# Patient Record
Sex: Female | Born: 1950 | Race: White | Hispanic: No | Marital: Married | Smoking: Never smoker
Health system: Southern US, Community
[De-identification: ages and names within clinical notes are randomized; demographics above are authoritative.]

## PROBLEM LIST (undated history)

## (undated) DIAGNOSIS — E785 Hyperlipidemia, unspecified: Secondary | ICD-10-CM

## (undated) DIAGNOSIS — M3130 Wegener's granulomatosis without renal involvement: Secondary | ICD-10-CM

## (undated) DIAGNOSIS — B001 Herpesviral vesicular dermatitis: Secondary | ICD-10-CM

## (undated) DIAGNOSIS — C801 Malignant (primary) neoplasm, unspecified: Secondary | ICD-10-CM

## (undated) DIAGNOSIS — J40 Bronchitis, not specified as acute or chronic: Secondary | ICD-10-CM

## (undated) DIAGNOSIS — M941 Relapsing polychondritis: Secondary | ICD-10-CM

## (undated) DIAGNOSIS — M8580 Other specified disorders of bone density and structure, unspecified site: Secondary | ICD-10-CM

## (undated) DIAGNOSIS — M1990 Unspecified osteoarthritis, unspecified site: Secondary | ICD-10-CM

## (undated) DIAGNOSIS — N870 Mild cervical dysplasia: Secondary | ICD-10-CM

## (undated) DIAGNOSIS — K219 Gastro-esophageal reflux disease without esophagitis: Secondary | ICD-10-CM

## (undated) DIAGNOSIS — M542 Cervicalgia: Secondary | ICD-10-CM

## (undated) DIAGNOSIS — E039 Hypothyroidism, unspecified: Secondary | ICD-10-CM

## (undated) DIAGNOSIS — Z5189 Encounter for other specified aftercare: Secondary | ICD-10-CM

## (undated) DIAGNOSIS — U071 COVID-19: Secondary | ICD-10-CM

## (undated) DIAGNOSIS — E063 Autoimmune thyroiditis: Secondary | ICD-10-CM

## (undated) DIAGNOSIS — T7840XA Allergy, unspecified, initial encounter: Secondary | ICD-10-CM

## (undated) DIAGNOSIS — L02519 Cutaneous abscess of unspecified hand: Secondary | ICD-10-CM

## (undated) DIAGNOSIS — M810 Age-related osteoporosis without current pathological fracture: Secondary | ICD-10-CM

## (undated) HISTORY — DX: Unspecified osteoarthritis, unspecified site: M19.90

## (undated) HISTORY — DX: Wegener's granulomatosis without renal involvement: M31.30

## (undated) HISTORY — DX: COVID-19: U07.1

## (undated) HISTORY — PX: BREAST LUMPECTOMY: SHX2

## (undated) HISTORY — DX: Gastro-esophageal reflux disease without esophagitis: K21.9

## (undated) HISTORY — DX: Autoimmune thyroiditis: E06.3

## (undated) HISTORY — DX: Other specified disorders of bone density and structure, unspecified site: M85.80

## (undated) HISTORY — DX: Age-related osteoporosis without current pathological fracture: M81.0

## (undated) HISTORY — DX: Bronchitis, not specified as acute or chronic: J40

## (undated) HISTORY — DX: Hyperlipidemia, unspecified: E78.5

## (undated) HISTORY — PX: BREAST BIOPSY: SHX20

## (undated) HISTORY — DX: Allergy, unspecified, initial encounter: T78.40XA

## (undated) HISTORY — DX: Hypothyroidism, unspecified: E03.9

## (undated) HISTORY — PX: LUNG BIOPSY: SHX232

## (undated) HISTORY — DX: Herpesviral vesicular dermatitis: B00.1

## (undated) HISTORY — DX: Relapsing polychondritis: M94.1

## (undated) HISTORY — PX: WISDOM TOOTH EXTRACTION: SHX21

## (undated) HISTORY — PX: COLPOSCOPY: SHX161

## (undated) HISTORY — DX: Cutaneous abscess of unspecified hand: L02.519

## (undated) HISTORY — DX: Encounter for other specified aftercare: Z51.89

## (undated) HISTORY — PX: ESOPHAGEAL DILATION: SHX303

## (undated) HISTORY — DX: Malignant (primary) neoplasm, unspecified: C80.1

## (undated) HISTORY — DX: Cervicalgia: M54.2

## (undated) HISTORY — DX: Mild cervical dysplasia: N87.0

---

## 1999-11-22 ENCOUNTER — Other Ambulatory Visit: Admission: RE | Admit: 1999-11-22 | Discharge: 1999-11-22 | Payer: Self-pay | Admitting: Obstetrics and Gynecology

## 2000-03-26 ENCOUNTER — Encounter: Admission: RE | Admit: 2000-03-26 | Discharge: 2000-03-26 | Payer: Self-pay | Admitting: Obstetrics and Gynecology

## 2000-03-26 ENCOUNTER — Encounter: Payer: Self-pay | Admitting: Obstetrics and Gynecology

## 2001-11-02 ENCOUNTER — Other Ambulatory Visit: Admission: RE | Admit: 2001-11-02 | Discharge: 2001-11-02 | Payer: Self-pay | Admitting: Obstetrics and Gynecology

## 2002-04-06 ENCOUNTER — Encounter: Payer: Self-pay | Admitting: Obstetrics and Gynecology

## 2002-04-06 ENCOUNTER — Encounter: Admission: RE | Admit: 2002-04-06 | Discharge: 2002-04-06 | Payer: Self-pay | Admitting: Obstetrics and Gynecology

## 2004-04-23 ENCOUNTER — Other Ambulatory Visit: Admission: RE | Admit: 2004-04-23 | Discharge: 2004-04-23 | Payer: Self-pay | Admitting: Obstetrics and Gynecology

## 2004-04-25 ENCOUNTER — Encounter: Admission: RE | Admit: 2004-04-25 | Discharge: 2004-04-25 | Payer: Self-pay

## 2004-11-14 ENCOUNTER — Ambulatory Visit: Payer: Self-pay | Admitting: Internal Medicine

## 2005-10-21 ENCOUNTER — Encounter: Admission: RE | Admit: 2005-10-21 | Discharge: 2005-10-21 | Payer: Self-pay | Admitting: Obstetrics and Gynecology

## 2005-10-28 ENCOUNTER — Other Ambulatory Visit: Admission: RE | Admit: 2005-10-28 | Discharge: 2005-10-28 | Payer: Self-pay | Admitting: Obstetrics and Gynecology

## 2006-12-07 ENCOUNTER — Encounter: Admission: RE | Admit: 2006-12-07 | Discharge: 2006-12-07 | Payer: Self-pay | Admitting: Obstetrics and Gynecology

## 2006-12-11 ENCOUNTER — Encounter: Admission: RE | Admit: 2006-12-11 | Discharge: 2006-12-11 | Payer: Self-pay | Admitting: Obstetrics and Gynecology

## 2006-12-14 ENCOUNTER — Other Ambulatory Visit: Admission: RE | Admit: 2006-12-14 | Discharge: 2006-12-14 | Payer: Self-pay | Admitting: Obstetrics and Gynecology

## 2008-04-18 ENCOUNTER — Encounter: Admission: RE | Admit: 2008-04-18 | Discharge: 2008-04-18 | Payer: Self-pay | Admitting: Obstetrics and Gynecology

## 2008-04-19 ENCOUNTER — Ambulatory Visit: Payer: Self-pay | Admitting: Obstetrics and Gynecology

## 2008-04-19 ENCOUNTER — Other Ambulatory Visit: Admission: RE | Admit: 2008-04-19 | Discharge: 2008-04-19 | Payer: Self-pay | Admitting: Obstetrics and Gynecology

## 2008-04-19 ENCOUNTER — Encounter: Payer: Self-pay | Admitting: Obstetrics and Gynecology

## 2008-10-16 ENCOUNTER — Ambulatory Visit: Payer: Self-pay | Admitting: Internal Medicine

## 2008-10-16 DIAGNOSIS — K219 Gastro-esophageal reflux disease without esophagitis: Secondary | ICD-10-CM | POA: Insufficient documentation

## 2008-10-16 DIAGNOSIS — E785 Hyperlipidemia, unspecified: Secondary | ICD-10-CM | POA: Insufficient documentation

## 2008-10-16 DIAGNOSIS — E039 Hypothyroidism, unspecified: Secondary | ICD-10-CM | POA: Insufficient documentation

## 2008-10-16 DIAGNOSIS — M810 Age-related osteoporosis without current pathological fracture: Secondary | ICD-10-CM | POA: Insufficient documentation

## 2008-10-16 DIAGNOSIS — M3130 Wegener's granulomatosis without renal involvement: Secondary | ICD-10-CM | POA: Insufficient documentation

## 2008-10-16 DIAGNOSIS — M948X9 Other specified disorders of cartilage, unspecified sites: Secondary | ICD-10-CM | POA: Insufficient documentation

## 2008-10-18 ENCOUNTER — Ambulatory Visit: Payer: Self-pay | Admitting: Internal Medicine

## 2008-10-24 ENCOUNTER — Encounter (INDEPENDENT_AMBULATORY_CARE_PROVIDER_SITE_OTHER): Payer: Self-pay | Admitting: *Deleted

## 2009-05-14 ENCOUNTER — Encounter: Payer: Self-pay | Admitting: Internal Medicine

## 2009-11-12 ENCOUNTER — Encounter: Admission: RE | Admit: 2009-11-12 | Discharge: 2009-11-12 | Payer: Self-pay | Admitting: Obstetrics and Gynecology

## 2009-11-21 ENCOUNTER — Encounter: Admission: RE | Admit: 2009-11-21 | Discharge: 2009-11-21 | Payer: Self-pay | Admitting: Obstetrics and Gynecology

## 2009-11-29 ENCOUNTER — Other Ambulatory Visit: Admission: RE | Admit: 2009-11-29 | Discharge: 2009-11-29 | Payer: Self-pay | Admitting: Obstetrics and Gynecology

## 2009-11-29 ENCOUNTER — Ambulatory Visit: Payer: Self-pay | Admitting: Obstetrics and Gynecology

## 2009-12-11 ENCOUNTER — Encounter: Payer: Self-pay | Admitting: Internal Medicine

## 2009-12-11 ENCOUNTER — Encounter (INDEPENDENT_AMBULATORY_CARE_PROVIDER_SITE_OTHER): Payer: Self-pay | Admitting: *Deleted

## 2009-12-17 ENCOUNTER — Telehealth (INDEPENDENT_AMBULATORY_CARE_PROVIDER_SITE_OTHER): Payer: Self-pay | Admitting: *Deleted

## 2010-01-01 ENCOUNTER — Ambulatory Visit (HOSPITAL_BASED_OUTPATIENT_CLINIC_OR_DEPARTMENT_OTHER): Admission: RE | Admit: 2010-01-01 | Discharge: 2010-01-01 | Payer: Self-pay | Admitting: Surgery

## 2010-01-01 ENCOUNTER — Encounter: Admission: RE | Admit: 2010-01-01 | Discharge: 2010-01-01 | Payer: Self-pay | Admitting: Surgery

## 2010-01-07 ENCOUNTER — Ambulatory Visit: Payer: Self-pay | Admitting: Internal Medicine

## 2010-01-07 LAB — CONVERTED CEMR LAB: Vit D, 25-Hydroxy: 43 ng/mL (ref 30–89)

## 2010-01-13 LAB — CONVERTED CEMR LAB
Albumin: 3.8 g/dL (ref 3.5–5.2)
Alkaline Phosphatase: 85 units/L (ref 39–117)
Basophils Relative: 0.6 % (ref 0.0–3.0)
Bilirubin, Direct: 0.1 mg/dL (ref 0.0–0.3)
CO2: 27 meq/L (ref 19–32)
Chloride: 109 meq/L (ref 96–112)
Cholesterol: 229 mg/dL — ABNORMAL HIGH (ref 0–200)
Creatinine, Ser: 0.9 mg/dL (ref 0.4–1.2)
Direct LDL: 161.3 mg/dL
Glucose, Bld: 101 mg/dL — ABNORMAL HIGH (ref 70–99)
HCT: 38.9 % (ref 36.0–46.0)
Lymphocytes Relative: 21.1 % (ref 12.0–46.0)
Lymphs Abs: 1.3 10*3/uL (ref 0.7–4.0)
MCV: 87.3 fL (ref 78.0–100.0)
Neutrophils Relative %: 66.7 % (ref 43.0–77.0)
Platelets: 383 10*3/uL (ref 150.0–400.0)
Sodium: 140 meq/L (ref 135–145)
Total CHOL/HDL Ratio: 5

## 2010-01-14 ENCOUNTER — Ambulatory Visit: Payer: Self-pay | Admitting: Internal Medicine

## 2010-01-14 DIAGNOSIS — Z9189 Other specified personal risk factors, not elsewhere classified: Secondary | ICD-10-CM | POA: Insufficient documentation

## 2010-01-16 ENCOUNTER — Encounter: Payer: Self-pay | Admitting: Internal Medicine

## 2010-04-22 ENCOUNTER — Ambulatory Visit: Payer: Self-pay | Admitting: Internal Medicine

## 2010-04-29 LAB — CONVERTED CEMR LAB
Cholesterol: 200 mg/dL (ref 0–200)
TSH: 0.03 microintl units/mL — ABNORMAL LOW (ref 0.35–5.50)
VLDL: 24 mg/dL (ref 0.0–40.0)

## 2010-05-23 ENCOUNTER — Ambulatory Visit: Payer: Self-pay | Admitting: Internal Medicine

## 2010-05-28 ENCOUNTER — Encounter: Payer: Self-pay | Admitting: Internal Medicine

## 2010-07-14 ENCOUNTER — Encounter: Payer: Self-pay | Admitting: Obstetrics and Gynecology

## 2010-07-21 LAB — CONVERTED CEMR LAB
Cholesterol, target level: 200 mg/dL
Cholesterol: 201 mg/dL — ABNORMAL HIGH (ref 0–200)
Direct LDL: 123.9 mg/dL
HDL goal, serum: 40 mg/dL
Hgb A1c MFr Bld: 5.8 % (ref 4.6–6.5)
LDL Goal: 160 mg/dL
Vit D, 25-Hydroxy: 24 ng/mL — ABNORMAL LOW (ref 30–89)

## 2010-07-23 NOTE — Letter (Signed)
Summary: Primary Care Appointment Letter  [facility] [place]  [address] [place], [place] [address]   Phone: [phone]  Fax: [phone]    12/11/2009 MRN: [id number]  [facility] [address] [place], [place]  [address]  Dear Ms. Molly Erickson,   Your Primary Care Physician Marga Melnick MD has indicated that:    ____X___it is time to schedule an appointment FOR CPX AND FASTING LABS BEFORE FURTHER REFILLS WILL BE GIVEN. LAST OFFICE VISIT 10/18/2008.    _______you missed your appointment on______ and need to call and          reschedule.    _______you need to have lab work done.    _______you need to schedule an appointment discuss lab or test results.    _______you need to call to reschedule your appointment that is                       scheduled on _________.     Please call our office as soon as possible. Our phone number is [phone]_____.Our office is open 8a-5p, Monday through Friday.     Thank you,    [place] [facility] Scheduler

## 2010-07-23 NOTE — Letter (Signed)
Summary: [facility]   Imported By: Lanelle Bal 12/31/2009 10:47:30  _____________________________________________________________________  External Attachment:    Type:   Image     Comment:   External Document

## 2010-07-23 NOTE — Assessment & Plan Note (Signed)
Summary: CPX ---ALREADY HAD LABS////SPH   Vital Signs:  Patient profile:   60 year old female Height:      67.75 inches Weight:      190.4 pounds BMI:     29.27 Temp:     97.6 degrees F oral Pulse rate:   76 / minute Resp:     14 per minute BP sitting:   116 / 78  (left arm) Cuff size:   large  Vitals Entered By: Shonna Chock CMA (January 14, 2010 11:04 AM)  CC: Lipid Management   CC:  Lipid Management.  History of Present Illness: Molly Erickson is here for a physical; she is presently asymptomatic.  Lipid Management History:      Positive NCEP/ATP III risk factors include female age 30 years old or older.  Negative NCEP/ATP III risk factors include no history of early menopause without estrogen hormone replacement, non-diabetic, no family history for ischemic heart disease, non-tobacco-user status, non-hypertensive, no ASHD (atherosclerotic heart disease), no prior stroke/TIA, no peripheral vascular disease, and no history of aortic aneurysm.     Current Medications (verified): 1)  Synthroid 175 Mcg Tabs (Levothyroxine Sodium) .Marland Kitchen.. 1 By Mouth Once Daily Except 1/2 Every Weds- Office Visit and Labs Due Now 2)  Nexium 40 Mg Cpdr (Esomeprazole Magnesium) .Marland Kitchen.. 1 By Mouth Once Daily As Needed 3)  Vit D ? Dose .Marland Kitchen.. 1 By Mouth Once Daily 4)  Citrate-? Dose (Calcium and Vit D) .Marland Kitchen.. 1 By Mouth Once Daily 5)  Imran-? Dose .Marland Kitchen.. 1 By Mouth Once Daily  Allergies (verified): No Known Drug Allergies  Past History:  Past Medical History: Wegener's ,PMH of 1989  ; Polychondritis , Prednisone   as  intermittent burst therapy over 2 years , mainitenance dose of 10 mg  for period 04/2009 - 11/2009; Imuran therapy since 04/2009 GERD Hypothyroidism Osteoporosis Hyperlipidemia: NMR Lipoprofile: LDL 124(1435/ 606), HDL 52, TG 122. LDL goal = < 115, ideally < 85. Framingham Study LDL goal = < 160.  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 Eyes:  Denies blurring, double vision, and vision loss-both eyes. ENT:  Denies difficulty swallowing, hoarseness, nasal congestion, and postnasal drainage. CV:  Complains of swelling of hands; denies chest pain or discomfort, difficulty breathing at night, difficulty breathing while lying down, leg cramps with exertion, palpitations, shortness of breath with exertion, and swelling of feet. Resp:  Complains of cough; denies chest pain with inspiration and wheezing; Scant sputum  yellow in am; cough since 2 nd week of June. GI:  Denies abdominal pain, bloody stools, dark tarry stools, and indigestion; No dysphagia;  some  dyspepsia, Nexium taken "most nights". GU:  Denies discharge, dysuria, and hematuria. MS:  Denies joint pain, joint redness, joint swelling, low back pain, mid back pain, and thoracic pain. Derm:  Denies changes in nail beds, dryness, and hair loss. Neuro:  Denies disturbances in coordination, numbness, poor balance, and  tingling. Psych:  Denies anxiety, depression, easily angered, easily tearful, and irritability. Endo:  Complains of cold intolerance; denies excessive hunger, excessive thirst, excessive urination, and heat intolerance. Heme:  Denies abnormal bruising and bleeding. Allergy:  Denies itching eyes and sneezing.  Physical Exam  General:  well-nourished; alert,appropriate and cooperative  throughout examination Head:  Normocephalic and atraumatic without obvious abnormalities. Eyes:  No corneal or conjunctival inflammation noted.Perrla. Funduscopic exam benign, without hemorrhages, exudates or papilledema. Ears:  External ear exam shows no significant lesions or deformities.  Otoscopic examination reveals clear canals, tympanic membranes are intact bilaterally without bulging, retraction, inflammation or discharge. Hearing is grossly normal bilaterally. Nose:  External nasal examination shows no deformity or inflammation. Nasal mucosa are pink and moist without lesions or exudates. Mouth:  Oral mucosa and oropharynx without lesions or exudates.  Teeth in good repair. Neck:  No deformities, masses, or tenderness noted. Lungs:  Normal respiratory effort, chest expands symmetrically. Lungs are clear to auscultation, no crackles or wheezes. Minor dry cough Heart:  Normal rate and regular rhythm. S1 and S2 normal without gallop, murmur, click, rub .S4 Abdomen:  Bowel sounds positive,abdomen soft and non-tender without masses, organomegaly or hernias noted. Genitalia:  Dr Eda Paschal Msk:  No deformity or scoliosis noted of thoracic or lumbar spine.   Pulses:  R and L carotid,radial,dorsalis pedis and posterior tibial pulses are full and equal bilaterally Extremities:  No clubbing, cyanosis, edema, or deformity noted with normal full range of motion of all joints.   Neurologic:  alert & oriented X3 and DTRs symmetrical and normal.   Skin:  Intact without suspicious lesions or rashes Cervical Nodes:  No lymphadenopathy noted Axillary Nodes:  No palpable lymphadenopathy Psych:  memory intact for recent and remote, normally interactive, and good eye contact.     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 1 by mouth once daily as needed  Problem # 7:  HYPOTHYROIDISM (ICD-244.9)  Her updated medication list for this problem includes:    Synthroid 175 Mcg Tabs (Levothyroxine sodium) .Marland Kitchen... 1 by mouth once daily except 1/2 every mon, weds , & fri  Problem # 8:  OSTEOPOROSIS (ICD-733.00)  Complete Medication List: 1)  Synthroid 175 Mcg Tabs (Levothyroxine sodium) .Marland Kitchen.. 1 by mouth once daily except 1/2 every mon, weds , & fri 2)  Nexium 40 Mg Cpdr (Esomeprazole magnesium) .Marland Kitchen.. 1 by mouth once daily as needed 3)  Vit D ? Dose  .Marland KitchenMarland Kitchen. 1 by mouth once daily 4)  Citrate-? Dose (calcium and Vit D)  .Marland Kitchen.. 1 by mouth once daily 5)  Imran-? Dose  .Marland KitchenMarland Kitchen. 1 by mouth once daily 6)  Azithromycin 250 Mg Tabs (Azithromycin) .... As per pack  Lipid Assessment/Plan:      Based on NCEP/ATP III, the patient's risk factor category is "0-1 risk factors".  The patient's lipid goals are as follows: Total cholesterol goal is 200; LDL cholesterol goal is 160; HDL cholesterol goal is 40; Triglyceride goal is 150.    Patient Instructions:  1)  Avoid foods high in acid (tomatoes, citrus juices, spicy foods). Avoid eating within two hours of lying down or before exercising. Do not over eat; try smaller more frequent meals. Elevate head of bed twelve inches when sleeping. Take Nexium 30 min pre b'fast & Synthroid at bedtime . Review Dr Gildardo Griffes book Eat, Drink & Be Healthy for dietary cholesterol. Please schedule a follow-up  fasting  Lab appointment in 3 months for lipids & TSH  ( 244.9,272.4).It is important that you exercise regularly at least 20 minutes 5 times a week. If you develop chest pain, have severe difficulty breathing,  or feel very tired , stop exercising immediately and seek medical attention. BMD every 25 months; vitamin D level annually. CXray if cough persists post antibiotics. Prescriptions: AZITHROMYCIN 250 MG TABS (AZITHROMYCIN) as per pack  #1 x 0   Entered and Authorized by:   Marga Melnick MD   Signed by:   Marga Melnick MD on 01/14/2010   Method used:   Print then Give to Patient   RxID:   [id number] SYNTHROID 175 MCG TABS (LEVOTHYROXINE SODIUM) 1 by mouth once daily EXCEPT 1/2 every Mon, Weds , & Fri Brand medically necessary #90 x 3   Entered and Authorized by:   Marga Melnick MD   Signed by:   Marga Melnick MD on 01/14/2010   Method used:   Print then Give to Patient   RxID:   [id number]    Tetanus Vaccine (to be given today)   Appended Document: CPX ---ALREADY HAD LABS////SPH   Immunizations Administered:  Tetanus Vaccine:    Vaccine Type: Tdap    Site: right deltoid    Mfr: GlaxoSmithK[employer]    Dose: 0.5 ml    Route: IM    Given by: Shonna Chock CMA    Exp. Date: 09/15/2011    Lot #: [id number]    VIS given: 05/11/07 version given January 14, 2010.

## 2010-07-23 NOTE — Assessment & Plan Note (Signed)
Summary: DISCUSS   Vital Signs:  Patient profile:   60 year old female Weight:      181 pounds BMI:     27.82 Pulse rate:   64 / minute Resp:     14 per minute BP sitting:   128 / 82  (left arm) Cuff size:   large  Vitals Entered By: Shonna Chock CMA (May 23, 2010 10:32 AM) CC: Follow-up visit: discuss labs (dicuss labs), Lipid Management   CC:  Follow-up visit: discuss labs (dicuss labs) and Lipid Management.  History of Present Illness:        Thyroid function tests reviewed ; the TSH has DECREASED despite decrease in dose. Physiology of thyroid : pituitary axis discussed. Hyperlipidemia Follow-Up       The patient denies the following symptoms: chest pain/pressure, exercise intolerance, dypsnea, and palpitations.  Dietary compliance has been good.  The patient reports exercising 4-5 X per week.  LDL 133, down from 161. NMR goal  = < 115.  Lipid Management History:      Positive NCEP/ATP III risk factors include female age 7 years old or older.  Negative NCEP/ATP III risk factors include no history of early menopause without estrogen hormone replacement, non-diabetic, no family history for ischemic heart disease, non-tobacco-user status, non-hypertensive, no ASHD (atherosclerotic heart disease), no prior stroke/TIA, no peripheral vascular disease, and no history of aortic aneurysm.     Current Medications (verified): 1)  Synthroid 175 Mcg Tabs (Levothyroxine Sodium) .Marland Kitchen.. 1 By Mouth Once Daily Except 1/2 Every Mon, Weds , & Fri 2)  Nexium 40 Mg Cpdr (Esomeprazole Magnesium) .Marland Kitchen.. 1 By Mouth Once Daily As Needed 3)  Vit D ? Dose .Marland Kitchen.. 1 By Mouth Once Daily 4)  Citrate-? Dose (Calcium and Vit D) .Marland Kitchen.. 1 By Mouth Once Daily 5)  Imuran 50 Mg Tabs (Azathioprine) .Marland Kitchen.. 1 By Mouth Two Times A Day  Allergies (verified): No Known Drug Allergies  Past History:  Past Medical History: Wegener's ,PMH of 1989  ; Polychondritis , Prednisone   as  intermittent burst therapy over 2 years ,  mainitenance dose of 10 mg  for period 04/2009 - 11/2009; Imuran therapy since 04/2009 GERD Hypothyroidism Osteoporosis Hyperlipidemia: NMR Lipoprofile: LDL 124(1435/ 606), HDL 52, TG 122. LDL goal = < 115, ideally < 85. Framingham Study LDL goal = < 160. No premature MI in FH.  Review of Systems General:  Variable fatigue , esp late afternoon. Weight down 10#.Marland Kitchen CV:  Complains of swelling of hands. GI:  Denies constipation and diarrhea. Derm:  Denies changes in nail beds, dryness, and hair loss. Neuro:  Denies numbness and tingling. Endo:  Complains of cold intolerance; denies heat intolerance.  Physical Exam  General:  healthy-appearing.   Eyes:   Perrla. No lid lag Neck:  No deformities, masses, or tenderness noted. Heart:  Normal rate and regular rhythm. S1 and S2 normal without gallop, murmur, click, rub . Neurologic:  DTRs symmetrical and normal.   No tremor Skin:  Intact without suspicious lesions or rashes Psych:  normally interactive and good eye contact.     Impression & Recommendations:  Problem # 1:  HYPERLIPIDEMIA (ICD-272.4) LDL control good  Problem # 2:  HYPOTHYROIDISM (ICD-244.9) Over supplementation Her updated medication list for this problem includes:    Synthroid 175 Mcg Tabs (Levothyroxine sodium) .Marland Kitchen... 1 by mouth once daily except 1/2 every mon, weds ,  fri & sun  Complete Medication List: 1)  Synthroid 175 Mcg Tabs (  Levothyroxine sodium) .Marland Kitchen.. 1 by mouth once daily except 1/2 every mon, weds ,  fri & sun 2)  Nexium 40 Mg Cpdr (Esomeprazole magnesium) .Marland Kitchen.. 1 by mouth once daily as needed 3)  Vit D ? Dose  .Marland KitchenMarland Kitchen. 1 by mouth once daily 4)  Citrate-? Dose (calcium and Vit D)  .Marland Kitchen.. 1 by mouth once daily 5)  Imuran 50 Mg Tabs (Azathioprine) .Marland Kitchen.. 1 by mouth two times a day  Lipid Assessment/Plan:      Based on NCEP/ATP III, the patient's risk factor category is "0-1 risk factors".  The patient's lipid goals are as follows: Total cholesterol goal is 200; LDL  cholesterol goal is 115; HDL cholesterol goal is 40; Triglyceride goal is 150.    Patient Instructions: 1)  Please schedule a follow-up  lab appointment in 9-10  weeks for : 2)  TSH , free T3, free T4.   Orders Added: 1)  Est. Patient Level III [[address]]

## 2010-07-23 NOTE — Progress Notes (Signed)
Summary: NEED LAB WORK INFO FOR HER CPX--ADDED  Phone Note Call from Patient Call back at [facility] = [phone]   Caller: Patient Summary of Call: PATIENT HAS LABS SCHEDULED FOR 7/18  BECAUSE SHE HAS A CPX FOR 7/25---PLEASE LET ME KNOW WHAT LABS TO PUT IN FOR HER Initial call taken by: Jerolyn Shin,  December 17, 2009 11:16 AM  Follow-up for Phone Call        Lipid/Hep/CBCD/BMP/TSH/Vit D/Stool cards/Udip 272.4/v70.0/244.9/995.20 Follow-up by: Shonna Chock,  December 17, 2009 12:14 PM  Additional Follow-up for Phone Call Additional follow up Details #1::        ADDED INFO TO 7/18 LAB Additional Follow-up by: Jerolyn Shin,  December 17, 2009 2:21 PM

## 2010-07-23 NOTE — Letter (Signed)
Summary: [facility]   Imported By: Lanelle Bal 01/29/2010 14:17:38  _____________________________________________________________________  External Attachment:    Type:   Image     Comment:   External Document

## 2010-07-25 NOTE — Letter (Signed)
Summary: [facility]   Imported By: Lanelle Bal 06/19/2010 07:42:41  _____________________________________________________________________  External Attachment:    Type:   Image     Comment:   External Document

## 2010-07-29 ENCOUNTER — Other Ambulatory Visit: Payer: Self-pay

## 2010-07-31 ENCOUNTER — Other Ambulatory Visit: Payer: Self-pay

## 2010-07-31 ENCOUNTER — Other Ambulatory Visit: Payer: Self-pay | Admitting: Internal Medicine

## 2010-07-31 ENCOUNTER — Encounter (INDEPENDENT_AMBULATORY_CARE_PROVIDER_SITE_OTHER): Payer: Self-pay | Admitting: *Deleted

## 2010-07-31 DIAGNOSIS — E039 Hypothyroidism, unspecified: Secondary | ICD-10-CM

## 2010-07-31 LAB — T4, FREE: Free T4: 1.29 ng/dL (ref 0.60–1.60)

## 2010-07-31 LAB — T3, FREE: T3, Free: 2.7 pg/mL (ref 2.3–4.2)

## 2010-08-05 ENCOUNTER — Telehealth (INDEPENDENT_AMBULATORY_CARE_PROVIDER_SITE_OTHER): Payer: Self-pay | Admitting: *Deleted

## 2010-08-06 ENCOUNTER — Other Ambulatory Visit: Payer: BC Managed Care – PPO

## 2010-08-06 ENCOUNTER — Encounter (INDEPENDENT_AMBULATORY_CARE_PROVIDER_SITE_OTHER): Payer: Self-pay | Admitting: *Deleted

## 2010-08-06 ENCOUNTER — Other Ambulatory Visit: Payer: Self-pay | Admitting: Internal Medicine

## 2010-08-06 DIAGNOSIS — R5383 Other fatigue: Secondary | ICD-10-CM

## 2010-08-06 DIAGNOSIS — R5381 Other malaise: Secondary | ICD-10-CM

## 2010-08-06 LAB — CBC WITH DIFFERENTIAL/PLATELET
Eosinophils Absolute: 0.1 10*3/uL (ref 0.0–0.7)
Eosinophils Relative: 2.4 % (ref 0.0–5.0)
Lymphs Abs: 0.9 10*3/uL (ref 0.7–4.0)
Monocytes Relative: 5.8 % (ref 3.0–12.0)
Neutro Abs: 4.2 10*3/uL (ref 1.4–7.7)
Platelets: 274 10*3/uL (ref 150.0–400.0)
RDW: 15.2 % — ABNORMAL HIGH (ref 11.5–14.6)
WBC: 5.5 10*3/uL (ref 4.5–10.5)

## 2010-08-09 ENCOUNTER — Ambulatory Visit (INDEPENDENT_AMBULATORY_CARE_PROVIDER_SITE_OTHER): Payer: BC Managed Care – PPO | Admitting: Internal Medicine

## 2010-08-09 ENCOUNTER — Encounter: Payer: Self-pay | Admitting: Internal Medicine

## 2010-08-09 DIAGNOSIS — E039 Hypothyroidism, unspecified: Secondary | ICD-10-CM

## 2010-08-09 DIAGNOSIS — R5381 Other malaise: Secondary | ICD-10-CM | POA: Insufficient documentation

## 2010-08-09 DIAGNOSIS — R5383 Other fatigue: Secondary | ICD-10-CM

## 2010-08-09 DIAGNOSIS — Z9189 Other specified personal risk factors, not elsewhere classified: Secondary | ICD-10-CM | POA: Insufficient documentation

## 2010-08-14 NOTE — Assessment & Plan Note (Signed)
Summary: Follow-up on labs   Vital Signs:  Patient profile:   60 year old female Weight:      178.2 pounds BMI:     27.39 Pulse rate:   76 / minute Resp:     12 per minute BP sitting:   116 / 78  (left arm) Cuff size:   large  Vitals Entered By: Shonna Chock CMA (August 09, 2010 2:05 PM) CC: Follow-up visit: discuss labs (copy given) , Fatigue   CC:  Follow-up visit: discuss labs (copy given)  and Fatigue.  History of Present Illness:   She  presents with Fatigue  by late afternoon for months even  without physical  activity; primarily motivational fatigue.  The patient denies fever, night sweats, weight loss, exertional chest pain, dyspnea, cough, and hemoptysis.  Other symptoms include severe snoring but no apnea.  The patient denies the following symptoms: leg swelling, orthopnea, PND, melena, adenopathy, daytime sleepiness, and  hair/ skin changes.   Nails brittle.  Current Medications (verified): 1)  Synthroid 175 Mcg Tabs (Levothyroxine Sodium) .... 1/2 Once Daily Except 1 On Weds 2)  Nexium 40 Mg Cpdr (Esomeprazole Magnesium) .Marland Kitchen.. 1 By Mouth Once Daily As Needed 3)  Vit D ? Dose .Marland Kitchen.. 1 By Mouth Once Daily 4)  Citrate-? Dose (Calcium and Vit D) .Marland Kitchen.. 1 By Mouth Once Daily 5)  Imuran 50 Mg Tabs (Azathioprine) .Marland Kitchen.. 1 By Mouth Two Times A Day  Allergies (verified): No Known Drug Allergies  Physical Exam  General:  well-nourished,in no acute distress; alert,appropriate and cooperative throughout examination Mouth:  Oral mucosa and oropharynx without lesions or exudates.  Teeth in good repair. Some crowding of oropharynx Heart:  Normal rate and regular rhythm. S1 and S2 normal without gallop, murmur, click, rub . S4 Extremities:  No onycholysis Neurologic:  DTRs symmetrical and normal.  No tremor Skin:  Intact without suspicious lesions or rashes   Impression & Recommendations:  Problem # 1:  FATIGUE (ICD-780.79)  Orders: Sleep Disorder Referral (Sleep  Disorder)  Problem # 2:  SNORING, HX OF (ICD-V15.89)  Orders: Sleep Disorder Referral (Sleep Disorder)  Problem # 3:  HYPOTHYROIDISM (ICD-244.9) TSH too low; thyroid adjusted Her updated medication list for this problem includes:    Synthroid 175 Mcg Tabs (Levothyroxine sodium) .Marland Kitchen... 1/2 once daily except 1 on weds  Complete Medication List: 1)  Synthroid 175 Mcg Tabs (Levothyroxine sodium) .... 1/2 once daily except 1 on weds 2)  Nexium 40 Mg Cpdr (Esomeprazole magnesium) .Marland Kitchen.. 1 by mouth once daily as needed 3)  Vit D ? Dose  .Marland KitchenMarland Kitchen. 1 by mouth once daily 4)  Citrate-? Dose (calcium and Vit D)  .Marland Kitchen.. 1 by mouth once daily 5)  Imuran 50 Mg Tabs (Azathioprine) .Marland Kitchen.. 1 by mouth two times a day  Patient Instructions: 1)  TSH  in 8-10 weeks  , ICD-9:244.9.   Orders Added: 1)  Est. Patient Level III [51884] 2)  Sleep Disorder Referral [Sleep Disorder]

## 2010-08-14 NOTE — Progress Notes (Signed)
Summary: Lab results, Patient with Concern(s)   Phone Note Outgoing Call Call back at [facility] Phone [phone]   Call placed by: Shonna Chock CMA,  August 05, 2010 1:33 PM Call placed to: Patient Summary of Call: Spoke with patient: The correct new dose is Synthroid 175 micrograms 1/2 once daily EXCEPT 1 pill on Weds.TSH goal = 1-3. Recheck TSH in 8 -9 weeks (244.9). Hopp  Patient state metabolism is slower and she is more fatigued. Patient wonders if her level could be out of range due some other factor (other than med needs to be adjusted). Dr.Hopper please advise   Shonna Chock CMA  August 05, 2010 1:33 PM   Follow-up for Phone Call        Recommend CBC & dif, B12 level  then OV with meds (780.79) Follow-up by: Marga Melnick MD,  August 05, 2010 2:19 PM  Additional Follow-up for Phone Call Additional follow up Details #1::        Left message on machine to call back to office. Lucious Groves CMA  August 05, 2010 2:57 PM     Additional Follow-up for Phone Call Additional follow up Details #2::    Spoke with patient, scheduled appointment(s) Follow-up by: Shonna Chock CMA,  August 05, 2010 4:45 PM

## 2010-08-27 ENCOUNTER — Telehealth (INDEPENDENT_AMBULATORY_CARE_PROVIDER_SITE_OTHER): Payer: Self-pay | Admitting: *Deleted

## 2010-08-29 ENCOUNTER — Institutional Professional Consult (permissible substitution): Payer: BC Managed Care – PPO | Admitting: Pulmonary Disease

## 2010-09-03 NOTE — Progress Notes (Signed)
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 Patient would like to know what Dr.Hopper thinks about her canceling appointment.   Dr.Hopper please advise./Chrae Marlynn Perking CMA  August 27, 2010 4:32 PM   Follow-up for Phone Call        OK but monitor for apnea symptoms Follow-up by: Marga Melnick MD,  August 27, 2010 4:56 PM  Additional Follow-up for Phone Call Additional follow up Details #1::        spoke w/ patient aware of instructions ....Marland KitchenMarland KitchenDoristine Devoid CMA  August 28, 2010 9:10 AM

## 2010-10-10 ENCOUNTER — Other Ambulatory Visit: Payer: BC Managed Care – PPO

## 2010-10-10 ENCOUNTER — Other Ambulatory Visit: Payer: Self-pay | Admitting: Internal Medicine

## 2010-10-10 DIAGNOSIS — E039 Hypothyroidism, unspecified: Secondary | ICD-10-CM

## 2010-10-16 ENCOUNTER — Other Ambulatory Visit (INDEPENDENT_AMBULATORY_CARE_PROVIDER_SITE_OTHER): Payer: BC Managed Care – PPO

## 2010-10-16 ENCOUNTER — Other Ambulatory Visit: Payer: BC Managed Care – PPO

## 2010-10-16 DIAGNOSIS — E039 Hypothyroidism, unspecified: Secondary | ICD-10-CM

## 2010-11-15 ENCOUNTER — Ambulatory Visit (INDEPENDENT_AMBULATORY_CARE_PROVIDER_SITE_OTHER): Payer: BC Managed Care – PPO | Admitting: Internal Medicine

## 2010-11-15 ENCOUNTER — Encounter: Payer: Self-pay | Admitting: Internal Medicine

## 2010-11-15 DIAGNOSIS — L02519 Cutaneous abscess of unspecified hand: Secondary | ICD-10-CM

## 2010-11-15 HISTORY — DX: Cutaneous abscess of unspecified hand: L02.519

## 2010-11-15 MED ORDER — DOXYCYCLINE HYCLATE 100 MG PO TABS: 100.0000 mg | ORAL_TABLET | Freq: Two times a day (BID) | ORAL | Status: AC

## 2010-11-15 NOTE — Patient Instructions (Signed)
Antibiotics as prescribed Soak in warm-salted water UC or ER if symptoms severe, fever, increase swelling Call next week if not improving

## 2010-11-15 NOTE — Assessment & Plan Note (Signed)
Has a  very small abscess under the nail, the patient is immunosuppressed. I recommend antibiotics for a week, I also recommended to lancet the area. The patient declined to have the area lanveted so we agree that she will go to the  ER or urgent care if things get worse . If things are about the same, she may need to come back next week to be reassessed.

## 2010-11-15 NOTE — Progress Notes (Signed)
  Subjective:    Patient ID: Molly Erickson, female    DOB: 05/03/1951, 60 y.o.   MRN: [id number]  HPI 2 days ago noted swelling and pain at the tip of the right fifth finger. Similar symptoms 6 months ago at the left fifth finger that self resolved.  Past Medical History  Diagnosis Date  . Wegener's syndrome   . GERD (gastroesophageal reflux disease)   . Hypothyroidism   . Osteoporosis   . Hyperlipidemia    Past Surgical History  Procedure Date  . Lung biopsy   . Esophageal dilation     x 1  . Breast biopsy   . Breast lumpectomy      Review of Systems No fever or chills No injury Proximal finger is not swollen or tender.     Objective:   Physical Exam Alert, oriented, in no apparent distress. Left hand normal. Right hand, 5th digit:  she has 3x2 mm white fluid collection, 1/2 of it is under the nail Compared to the left side, there is some minimal, subtle, swelling over the tip of the finger . Mild tenderness to palpation l      Assessment & Plan:

## 2011-02-10 ENCOUNTER — Other Ambulatory Visit: Payer: Self-pay | Admitting: Obstetrics and Gynecology

## 2011-02-10 DIAGNOSIS — Z1231 Encounter for screening mammogram for malignant neoplasm of breast: Secondary | ICD-10-CM

## 2011-02-12 ENCOUNTER — Other Ambulatory Visit: Payer: Self-pay | Admitting: Internal Medicine

## 2011-02-12 NOTE — Telephone Encounter (Signed)
TSH 244.9 

## 2011-02-18 ENCOUNTER — Ambulatory Visit
Admission: RE | Admit: 2011-02-18 | Discharge: 2011-02-18 | Disposition: A | Discharge status: Home / Self Care | Payer: BC Managed Care – PPO | Source: Ambulatory Visit | Attending: Obstetrics and Gynecology | Admitting: Obstetrics and Gynecology

## 2011-02-18 DIAGNOSIS — Z1231 Encounter for screening mammogram for malignant neoplasm of breast: Secondary | ICD-10-CM

## 2011-04-26 ENCOUNTER — Other Ambulatory Visit: Payer: Self-pay | Admitting: Internal Medicine

## 2011-04-29 NOTE — Telephone Encounter (Signed)
TSH 244.9 DUE

## 2011-05-30 ENCOUNTER — Other Ambulatory Visit: Payer: Self-pay | Admitting: Internal Medicine

## 2011-05-30 NOTE — Telephone Encounter (Signed)
TSH 244.9 

## 2011-06-13 ENCOUNTER — Encounter: Payer: Self-pay | Admitting: Internal Medicine

## 2011-06-13 ENCOUNTER — Ambulatory Visit (INDEPENDENT_AMBULATORY_CARE_PROVIDER_SITE_OTHER): Payer: BC Managed Care – PPO | Admitting: Internal Medicine

## 2011-06-13 DIAGNOSIS — R3129 Other microscopic hematuria: Secondary | ICD-10-CM

## 2011-06-13 DIAGNOSIS — J209 Acute bronchitis, unspecified: Secondary | ICD-10-CM

## 2011-06-13 DIAGNOSIS — M3130 Wegener's granulomatosis without renal involvement: Secondary | ICD-10-CM

## 2011-06-13 LAB — POCT URINALYSIS DIPSTICK
Bilirubin, UA: NEGATIVE
Ketones, UA: NEGATIVE
Protein, UA: NEGATIVE
Spec Grav, UA: 1.01
pH, UA: 6

## 2011-06-13 MED ORDER — AZITHROMYCIN 250 MG PO TABS: ORAL_TABLET | ORAL | Status: AC

## 2011-06-13 MED ORDER — HYDROCODONE-HOMATROPINE 5-1.5 MG/5ML PO SYRP: 5.0000 mL | ORAL_SOLUTION | Freq: Four times a day (QID) | ORAL | Status: AC | PRN

## 2011-06-13 NOTE — Progress Notes (Signed)
  Subjective:    Patient ID: Molly Erickson, female    DOB: Oct 24, 1950, 60 y.o.   MRN: [id number]  HPI Respiratory tract infection Onset/symptoms:"cold" early December Exposures (illness/environmental/extrinsic):probably @ parties Progression of symptoms:residual rhinitis; chest congestion as of 12/19 Treatments/response:none except rest; Flu shot 12/15 Present symptoms: Fever/chills/sweats:minor chills Frontal headache:no Facial pain:no Nasal purulence:no Sore throat:slight Dental pain:no Lymphadenopathy: no Wheezing/shortness of breath:minor positional wheezing Cough/sputum/hemoptysis:only scant in am as yellow gray sputum Pleuritic pain:no Associated extrinsic/allergic symptoms:itchy eyes/ sneezing:some sneezing Past medical history: Seasonal allergies: yes/asthma:no Smoking history:never           Review of Systems she had blood work done 12/18 @ her Rheumatologist's which was normal by history. She had microscopic hematuria which was asymptomatic. Specifically she denies dysuria, hematuria, or pyuria based on her observations.     Objective:   Physical Exam General appearance: good  nourishment; no acute distress or increased work of breathing is present but she appears fatigued.  No  lymphadenopathy about the head, neck, or axilla noted.   Eyes: No conjunctival inflammation or lid edema is present.   Ears:  External ear exam shows no significant lesions or deformities.  Otoscopic examination reveals clear canals, tympanic membranes are intact bilaterally without bulging, retraction, inflammation or discharge.  Nose:  External nasal examination shows no deformity or inflammation. Nasal mucosa are pink and moist without lesions or exudates. No septal dislocation No obstruction to airflow. Hyponasal speech  Oral exam: Dental hygiene is good; lips and gums are healthy appearing.There is no oropharyngeal erythema or exudate noted.     Heart:  Normal rate and regular  rhythm. S1 and S2 normal without gallop, murmur, click, rub .S 4 Lungs:Chest clear to auscultation; no wheezes, rhonchi,rales ,or rubs present.No increased work of breathing.  NP cough paroxysmally  Extremities:  No cyanosis, edema, or clubbing  noted    Skin: Warm & dry w/o  tenting.          Assessment & Plan:   #1 bronchitis without clinical reactive airway disease although she does describe some nocturnal, positional wheezing.  #2 immunosuppression for Wegener's granulomatosis. Clinically no exacerbation. Labs normal 1218 by history  #3 microscopic hematuria, asymptomatic. C&S will be performed

## 2011-06-13 NOTE — Progress Notes (Signed)
Addended by: Candie Echevaria L on: 06/13/2011 05:15 PM   Modules accepted: Orders

## 2011-06-13 NOTE — Patient Instructions (Signed)
Plain Mucinex for thick secretions ;force NON dairy fluids for next 48 hrs. Use a Neti pot daily as needed for sinus congestion . Take NSAIDS ( Aleve, Advil, Naproxen) or Tylenol every 4 hrs as needed for fever as discussed based on label recommendations

## 2011-06-15 LAB — URINE CULTURE

## 2011-06-23 ENCOUNTER — Encounter: Payer: Self-pay | Admitting: Gynecology

## 2011-06-23 DIAGNOSIS — M8580 Other specified disorders of bone density and structure, unspecified site: Secondary | ICD-10-CM | POA: Insufficient documentation

## 2011-06-24 DIAGNOSIS — C801 Malignant (primary) neoplasm, unspecified: Secondary | ICD-10-CM

## 2011-06-24 HISTORY — DX: Malignant (primary) neoplasm, unspecified: C80.1

## 2011-06-25 ENCOUNTER — Ambulatory Visit (INDEPENDENT_AMBULATORY_CARE_PROVIDER_SITE_OTHER): Payer: BC Managed Care – PPO | Admitting: Obstetrics and Gynecology

## 2011-06-25 ENCOUNTER — Encounter: Payer: Self-pay | Admitting: Obstetrics and Gynecology

## 2011-06-25 ENCOUNTER — Other Ambulatory Visit (HOSPITAL_COMMUNITY)
Admission: RE | Admit: 2011-06-25 | Discharge: 2011-06-25 | Disposition: A | Discharge status: Home / Self Care | Payer: BC Managed Care – PPO | Source: Ambulatory Visit | Attending: Obstetrics and Gynecology | Admitting: Obstetrics and Gynecology

## 2011-06-25 VITALS — BP 120/74 | Ht 67.0 in | Wt 184.0 lb

## 2011-06-25 DIAGNOSIS — N870 Mild cervical dysplasia: Secondary | ICD-10-CM | POA: Insufficient documentation

## 2011-06-25 DIAGNOSIS — B001 Herpesviral vesicular dermatitis: Secondary | ICD-10-CM | POA: Insufficient documentation

## 2011-06-25 DIAGNOSIS — R319 Hematuria, unspecified: Secondary | ICD-10-CM | POA: Insufficient documentation

## 2011-06-25 DIAGNOSIS — J209 Acute bronchitis, unspecified: Secondary | ICD-10-CM | POA: Insufficient documentation

## 2011-06-25 DIAGNOSIS — M941 Relapsing polychondritis: Secondary | ICD-10-CM | POA: Insufficient documentation

## 2011-06-25 DIAGNOSIS — Z01419 Encounter for gynecological examination (general) (routine) without abnormal findings: Secondary | ICD-10-CM | POA: Insufficient documentation

## 2011-06-25 DIAGNOSIS — J40 Bronchitis, not specified as acute or chronic: Secondary | ICD-10-CM | POA: Insufficient documentation

## 2011-06-25 NOTE — Progress Notes (Signed)
Patient came to see me today for her annual GYN exam. She is having minimal menopausal symptoms. She does have vaginal dryness which requires lubricant with intercourse. For the moment she does not want to start vaginal estrogen. She's having no vaginal bleeding. She is having no pelvic pain. She is up-to-date on mammograms. She does bone density through her rheumatologist. She had a workup through Dr. Imelda Pillow office 4 microscopic hematuria 5 years ago. She still has microscopic hematuria. She had a breast excision for sclerosing intraductal papilloma with ductal hyperplasia through Dr. Jamey Ripa. Her mammograms have been normal since that.  Physical examination: Kennon Portela present. HEENT within normal limits. Neck: Thyroid not large. No masses. Supraclavicular nodes: not enlarged. Breasts: Examined in both sitting midline position. No skin changes and no masses. Abdomen: Soft no guarding rebound or masses or hernia. Pelvic: External: Within normal limits. BUS: Within normal limits. Vaginal:within normal limits. Good estrogen effect. No evidence of cystocele rectocele or enterocele. Cervix: clean. Uterus: Normal size and shape. Adnexa: No masses. Rectovaginal exam: Confirmatory and negative. Extremities: Within normal limits.  Assessment: #1. Atrophic vaginitis #2. 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 Microscopic hematuria  Plan: The patient called Dr. Imelda Pillow office and be sure he does not need to see her again. Continue yearly mammograms. Call when necessary need for vaginal estrogen.

## 2011-06-25 NOTE — Patient Instructions (Signed)
Call Dr. Imelda Pillow office and be sure she does not need followup this year for microscopic hematuria.

## 2011-07-01 ENCOUNTER — Telehealth: Payer: Self-pay | Admitting: *Deleted

## 2011-07-01 DIAGNOSIS — Z01419 Encounter for gynecological examination (general) (routine) without abnormal findings: Secondary | ICD-10-CM

## 2011-07-01 NOTE — Progress Notes (Signed)
Addended by: Melvenia Beam on: 07/01/2011 08:44 AM   Modules accepted: Orders

## 2011-07-01 NOTE — Telephone Encounter (Signed)
Message copied by Libby Maw on Tue Jul 01, 2011 11:26 AM ------      Message from: Ladona Horns E      Created: Tue Jul 01, 2011  8:39 AM      Regarding: urinalysis not done on 06/25/2011       Molly Erickson a UA was order on this patient but not done per [facility]. Dr,G would like this patient called and asked to come back for UA only.  Please put order in LAB348  DX V72.31   and let patient know she can come in anytime from 8:30-1 pm or 2-4:30 p. Mon-Friday. Apologize for this inconvenience, first day for [facility].            Thanks, Dennie Bible

## 2011-07-01 NOTE — Telephone Encounter (Signed)
Patient informed.  Will come in tomorrow morning.

## 2011-07-02 ENCOUNTER — Ambulatory Visit: Payer: BC Managed Care – PPO | Admitting: Obstetrics and Gynecology

## 2011-07-02 ENCOUNTER — Other Ambulatory Visit: Payer: Self-pay | Admitting: Obstetrics and Gynecology

## 2011-07-02 DIAGNOSIS — Z01419 Encounter for gynecological examination (general) (routine) without abnormal findings: Secondary | ICD-10-CM

## 2011-07-02 DIAGNOSIS — N390 Urinary tract infection, site not specified: Secondary | ICD-10-CM

## 2011-07-02 LAB — URINALYSIS, MICROSCOPIC ONLY: WBC, UA: NONE SEEN WBC/hpf (ref <3)

## 2011-07-02 LAB — URINALYSIS, ROUTINE W REFLEX MICROSCOPIC
Glucose, UA: NEGATIVE mg/dL
Leukocytes, UA: NEGATIVE
Protein, ur: NEGATIVE mg/dL
pH: 5.5 (ref 5.0–8.0)

## 2011-07-04 LAB — URINE CULTURE: Organism ID, Bacteria: NO GROWTH

## 2011-08-20 ENCOUNTER — Telehealth: Payer: Self-pay | Admitting: Internal Medicine

## 2011-08-20 DIAGNOSIS — E039 Hypothyroidism, unspecified: Secondary | ICD-10-CM

## 2011-08-20 MED ORDER — LEVOTHYROXINE SODIUM 175 MCG PO TABS: ORAL_TABLET | ORAL | Status: DC

## 2011-08-20 NOTE — Telephone Encounter (Signed)
Patient called the office to schedule a lab appointment. I didn't see any orders placed. Best number to reach patient is [phone]. Molly Erickson

## 2011-08-20 NOTE — Telephone Encounter (Signed)
Spoke with patient , patient would like to get labs to f/u on thyroid. Future order placed for [facility]

## 2011-08-27 ENCOUNTER — Other Ambulatory Visit (INDEPENDENT_AMBULATORY_CARE_PROVIDER_SITE_OTHER): Payer: BC Managed Care – PPO

## 2011-08-27 DIAGNOSIS — E039 Hypothyroidism, unspecified: Secondary | ICD-10-CM

## 2011-08-28 ENCOUNTER — Encounter: Payer: Self-pay | Admitting: Family Medicine

## 2011-08-28 ENCOUNTER — Ambulatory Visit (INDEPENDENT_AMBULATORY_CARE_PROVIDER_SITE_OTHER): Payer: BC Managed Care – PPO | Admitting: Family Medicine

## 2011-08-28 VITALS — BP 121/74 | HR 100 | Temp 97.8°F | Ht 67.5 in | Wt 185.2 lb

## 2011-08-28 DIAGNOSIS — J019 Acute sinusitis, unspecified: Secondary | ICD-10-CM

## 2011-08-28 MED ORDER — BENZONATATE 200 MG PO CAPS: 200.0000 mg | ORAL_CAPSULE | Freq: Three times a day (TID) | ORAL | Status: AC | PRN

## 2011-08-28 MED ORDER — AMOXICILLIN 875 MG PO TABS: 875.0000 mg | ORAL_TABLET | Freq: Two times a day (BID) | ORAL | Status: AC

## 2011-08-28 NOTE — Progress Notes (Signed)
  Subjective:    Patient ID: Molly Erickson, female    DOB: May 21, 1951, 61 y.o.   MRN: [id number]  HPI URI- has hx of Wegener's, on Im[place].  Husband recently ill.  sxs developed 2 weeks ago but worsened yesterday.  'extreme sore throat'.  Chills.  Subjective fever.  + nasal congestion.  Bilateral ear pressure, + sinus HA.  + cough- productive in the AM, mostly dry during the day.   Review of Systems For ROS see HPI     Objective:   Physical Exam  Vitals reviewed. Constitutional: She appears well-developed and well-nourished. No distress.  HENT:  Head: Normocephalic and atraumatic.  Right Ear: Tympanic membrane normal.  Left Ear: Tympanic membrane normal.  Nose: Mucosal edema and rhinorrhea present. Right sinus exhibits no maxillary sinus tenderness and no frontal sinus tenderness. Left sinus exhibits no maxillary sinus tenderness and no frontal sinus tenderness.  Mouth/Throat: Mucous membranes are normal. Oropharyngeal exudate and posterior oropharyngeal erythema (w/ PND) present. No posterior oropharyngeal edema or tonsillar abscesses.  Eyes: Conjunctivae and EOM are normal. Pupils are equal, round, and reactive to light.  Neck: Normal range of motion. Neck supple.  Cardiovascular: Normal rate, regular rhythm and normal heart sounds.   Pulmonary/Chest: Effort normal and breath sounds normal. No respiratory distress. She has no wheezes. She has no rales.       No cough heard  Lymphadenopathy:    She has no cervical adenopathy.          Assessment & Plan:

## 2011-08-28 NOTE — Patient Instructions (Signed)
Start the Amoxicillin twice daily w/ food for sinus infection Add Mucinex to thin your congestion Drink plenty of fluids Tessalon as needed for cough REST! Call with any questions or concerns Hang in there!

## 2011-08-29 ENCOUNTER — Telehealth: Payer: Self-pay | Admitting: *Deleted

## 2011-08-29 NOTE — Telephone Encounter (Signed)
Has pt been able to take her amoxicillin today w/out vomiting?  Should make sure she's drinking plenty of fluids, eating small amounts of bland things (toast, saltines, applesauce) and call if sxs change or worsen

## 2011-08-29 NOTE — Telephone Encounter (Signed)
Should attempt to take abx.  We can call in Zofran 4mg  TID prn for nausea, #20.  Remind her Saturday clinic is available if her sxs continue or worsen

## 2011-08-29 NOTE — Telephone Encounter (Signed)
Spoke to Pt who states that she is not having any nausea or vomiting now so she would not like to have Rx sent to pharmacy. Pt indicated that she will try to take med today and eat some food as well. Pt given contact info for sat clinic.

## 2011-08-29 NOTE — Telephone Encounter (Signed)
[facility] Triage Call Report Triage Record Num: [id number] Operator: Chevis Pretty Patient Name: Molly Erickson Call Date & Time: 08/29/2011 12:53:15PM Patient Phone: [phone] PCP: Lezlie Octave Patient Gender: Female PCP Fax : [phone] Patient DOB: 09-03-1950 Practice Name: [facility] Reason for Call: Caller: Nolene/Patient; PCP: Lelon Perla.; CB#: [phone]; ; ; Call regarding Seen in Office 08/28/11 and Is Being Treated for Sinus Infection; vomiting onset 08/28/11 overnight. States vomiting has stopped, but episodes were frequent throughout the night. Afebrile. No diarrhea. Per protocol, emergent symptoms denied; home care advised, with callback parameters given. Protocol(s) Used: Nausea or Vomiting Recommended Outcome per Protocol: Provide Home/Self Care Reason for Outcome: New onset of 3-4 episodes vomiting or diarrhea following mild abdominal cramping Care Advice: Antidiarrheal medications are usually unnecessary. Use only after consulting your provider. Application of A&D ointment or witch hazel medicated pads may help relieve anal irritation. ~ ~ Call provider if symptoms do not improve after 24 hours of home care. Call provider immediately if develop severe pain, black, tarry stools, bloody stools, blood-streaked or coffee ground-looking vomitus, or abdomen swollen. ~ ~ SYMPTOM / CONDITION MANAGEMENT ~ CAUTIONS Go to the ED if you have developed signs and symptoms of dehydration such as very dry mouth and tongue; increased pulse rate at rest; no urine output for 8 hours or more; increasing weakness or drowsiness, or lightheadedness when trying to sit upright or standing. ~ Vomiting and Diarrhea Care: - Do not eat solid foods until vomiting subsides. - Begin taking fluids by sucking on ice chips or popsicles or taking sips of cool, clear fluids (soda, fruit juices that are low acid, sports drinks or nonprescription oral rehydration  solution). - Gradually drink larger amounts of these fluids so that you are drinking six to eight 8 oz. (1.2 to 1.6 liters) of fluids a day. - Keep activity to a minimum. - Once vomiting and diarrhea subside, eat smaller, more frequent meals of easily digested foods such as crackers, toast, bananas, rice, cooked cereal, applesauce, broth, baked or mashed potatoes, chicken or [place] without skin. Eat slowly. - Take fluids 30 minutes before or 60 minutes after meals. - Avoid high fat, highly seasoned, high fiber or high sugar content foods. - Avoid extremely hot or cold foods. - Do not take pain medication (such as aspirin, NSAIDs) while nauseated or vomiting. - Do not drink caffeinated or alcoholic beverages. ~ 08/29/2011 1:04:23PM

## 2011-08-29 NOTE — Telephone Encounter (Signed)
Pt states that she has not tried to take any meds or eat anything because she just felt so bad.

## 2011-08-31 DIAGNOSIS — J019 Acute sinusitis, unspecified: Secondary | ICD-10-CM | POA: Insufficient documentation

## 2011-08-31 NOTE — Assessment & Plan Note (Signed)
New.  Given pt's hx of Wegener's and current immunosuppression, will start abx for likely early sinusitis.  Reviewed supportive care and red flags that should prompt return.  Pt expressed understanding and is in agreement w/ plan.

## 2011-09-17 ENCOUNTER — Encounter: Payer: Self-pay | Admitting: Internal Medicine

## 2011-09-17 ENCOUNTER — Other Ambulatory Visit: Payer: Self-pay | Admitting: Internal Medicine

## 2011-09-17 ENCOUNTER — Ambulatory Visit (INDEPENDENT_AMBULATORY_CARE_PROVIDER_SITE_OTHER): Payer: BC Managed Care – PPO | Admitting: Internal Medicine

## 2011-09-17 VITALS — BP 120/78 | HR 72 | Temp 97.7°F | Wt 182.6 lb

## 2011-09-17 DIAGNOSIS — N390 Urinary tract infection, site not specified: Secondary | ICD-10-CM

## 2011-09-17 DIAGNOSIS — E039 Hypothyroidism, unspecified: Secondary | ICD-10-CM

## 2011-09-17 DIAGNOSIS — R3129 Other microscopic hematuria: Secondary | ICD-10-CM

## 2011-09-17 LAB — POCT URINALYSIS DIPSTICK
Bilirubin, UA: NEGATIVE
Glucose, UA: NEGATIVE
Ketones, UA: NEGATIVE
Spec Grav, UA: 1.01

## 2011-09-17 MED ORDER — LEVOTHYROXINE SODIUM 175 MCG PO TABS: ORAL_TABLET | ORAL | Status: DC

## 2011-09-17 NOTE — Assessment & Plan Note (Signed)
The TSH has increased dramatically from 0.79 on 10/16/10 to 10.38 on 08/27/11. It is possible that this represents progressive thyroid dysfunction. TSH will be rechecked and medication dose increased appropriately. Care must be taken because of her associated comorbidity of osteoporosis. If the situation is unclear thyroid antibodies may be checked.

## 2011-09-17 NOTE — Progress Notes (Signed)
  Subjective:    Patient ID: Molly Erickson, female    DOB: 26-Jun-1950, 61 y.o.   MRN: [id number]  HPI Thyroid function monitor  Medications status(change in dose/brand/mode of administration):no Constitutional: Weight change: up 7#; Fatigue:no; Sleep pattern:moinor; Appetite:good Visual change(blurred/diplopia/visual loss):no; neg Ophth exam early March Hoarseness:no; Swallowing issues:some dysphagia; Dr Juanda Chance unaware Cardiovascular: Palpitations:no; Racing:no; Irregularity:no GI: Constipation:no; Diarrhea:no Derm: Change in nails/hair/skin:dryness Neuro: Numbness/tingling:no but Raynaud's; Tremor:no Psych: Anxiety:no; Depression:no; Panic attacks:no Endo: Temperature intolerance: Heat:no; Cold:yes from Raynaud's      Review of Systems She believes that the change in her TSH may reflect the impact of azathioprine which was started  Approximately 2 years ago. Her her TSH has been extremely low until 08/27/11 when it was 10.3. Prior to that time over the last 3 years it had  ranged from 0.03 to  0.79. The Pharmacopeia  was reviewed; azathioprine can interact with ACE inhibitors, allopurinol and methotrexate which increase activity &  toxicity. It may decrease activity anticoagulants, cyclosporin and neuromuscular blockers. There was no mention of interaction with thyroid medicines.     Objective:   Physical Exam  Gen.:  well-nourished; in no acute distress Eyes: Extraocular motion intact; no lid lag or proptosis Neck :thyroid normal; full ROM Heart: Normal rhythm and rate without  gallop, or extra heart sounds. Grade 1/2 systolic murmur Lungs: Chest clear to auscultation without rales,rales, wheezes Neuro:Deep tendon reflexes are equal and within normal limits; no tremor  Skin: Warm and dry without significant lesions or rashes; no onycholysis Psych: Normally communicative and interactive; no abnormal mood or affect clinically.         Assessment & Plan:

## 2011-09-17 NOTE — Progress Notes (Signed)
Addended by: Maurice Small on: 09/17/2011 04:46 PM   Modules accepted: Orders

## 2011-09-17 NOTE — Patient Instructions (Signed)
.  Share results with all MDs seen  

## 2011-09-17 NOTE — Progress Notes (Signed)
Addended by: Silvio Pate D on: 09/17/2011 05:34 PM   Modules accepted: Orders

## 2011-09-18 ENCOUNTER — Other Ambulatory Visit: Payer: Self-pay | Admitting: Internal Medicine

## 2011-09-18 LAB — TSH: TSH: 3.08 u[IU]/mL (ref 0.35–5.50)

## 2011-09-19 LAB — URINE CULTURE: Colony Count: 30000

## 2012-04-06 ENCOUNTER — Telehealth: Payer: Self-pay | Admitting: Internal Medicine

## 2012-04-06 ENCOUNTER — Other Ambulatory Visit: Payer: Self-pay | Admitting: Obstetrics and Gynecology

## 2012-04-06 DIAGNOSIS — Z1231 Encounter for screening mammogram for malignant neoplasm of breast: Secondary | ICD-10-CM

## 2012-04-06 NOTE — Telephone Encounter (Signed)
I cannot find any reports from EGD,colon or my office notes in Epic or in [facility]. I cannot help you unless I review those records ( ?paper chart?)

## 2012-04-07 NOTE — Telephone Encounter (Signed)
I have reviewed pt's record and agree that she may be a direct EGD/colon.

## 2012-04-07 NOTE — Telephone Encounter (Signed)
Paper chart on MD desk for review.

## 2012-04-21 IMAGING — MG MM BREAST SURGICAL SPECIMEN
2 series · 2 of 2 positions shown · non-contrast
Comparison: none

Addendum Begins

The pathology is consistent with a sclerosing intraductal papilloma
with calcifications.  There is no evidence for malignancy.
However, surgical excision is recommended.  The findings were
discussed with the patient and her questions were answered.  Post
biopsy wound care instructions were reviewed with the patient.  The
patient is scheduled to see Dr. Mahieu on 12/11/2009.  The biopsy
site is mildly tender without hematoma formation.
Addended by:  Danii Aujla, M.D. on 11/22/2009 [DATE].
Addendum Ends
CLINICAL DATA: Heterogeneous calcifications left breast.  Biopsy
was suggested.  As patient had some concern regarding her
rheumatology medications, the recommendation for breast biopsy was
discussed with Dr. Mahan, [place] partner of Dr. Ovalle, and it was
decided to proceed with biopsy today.

[L CC]
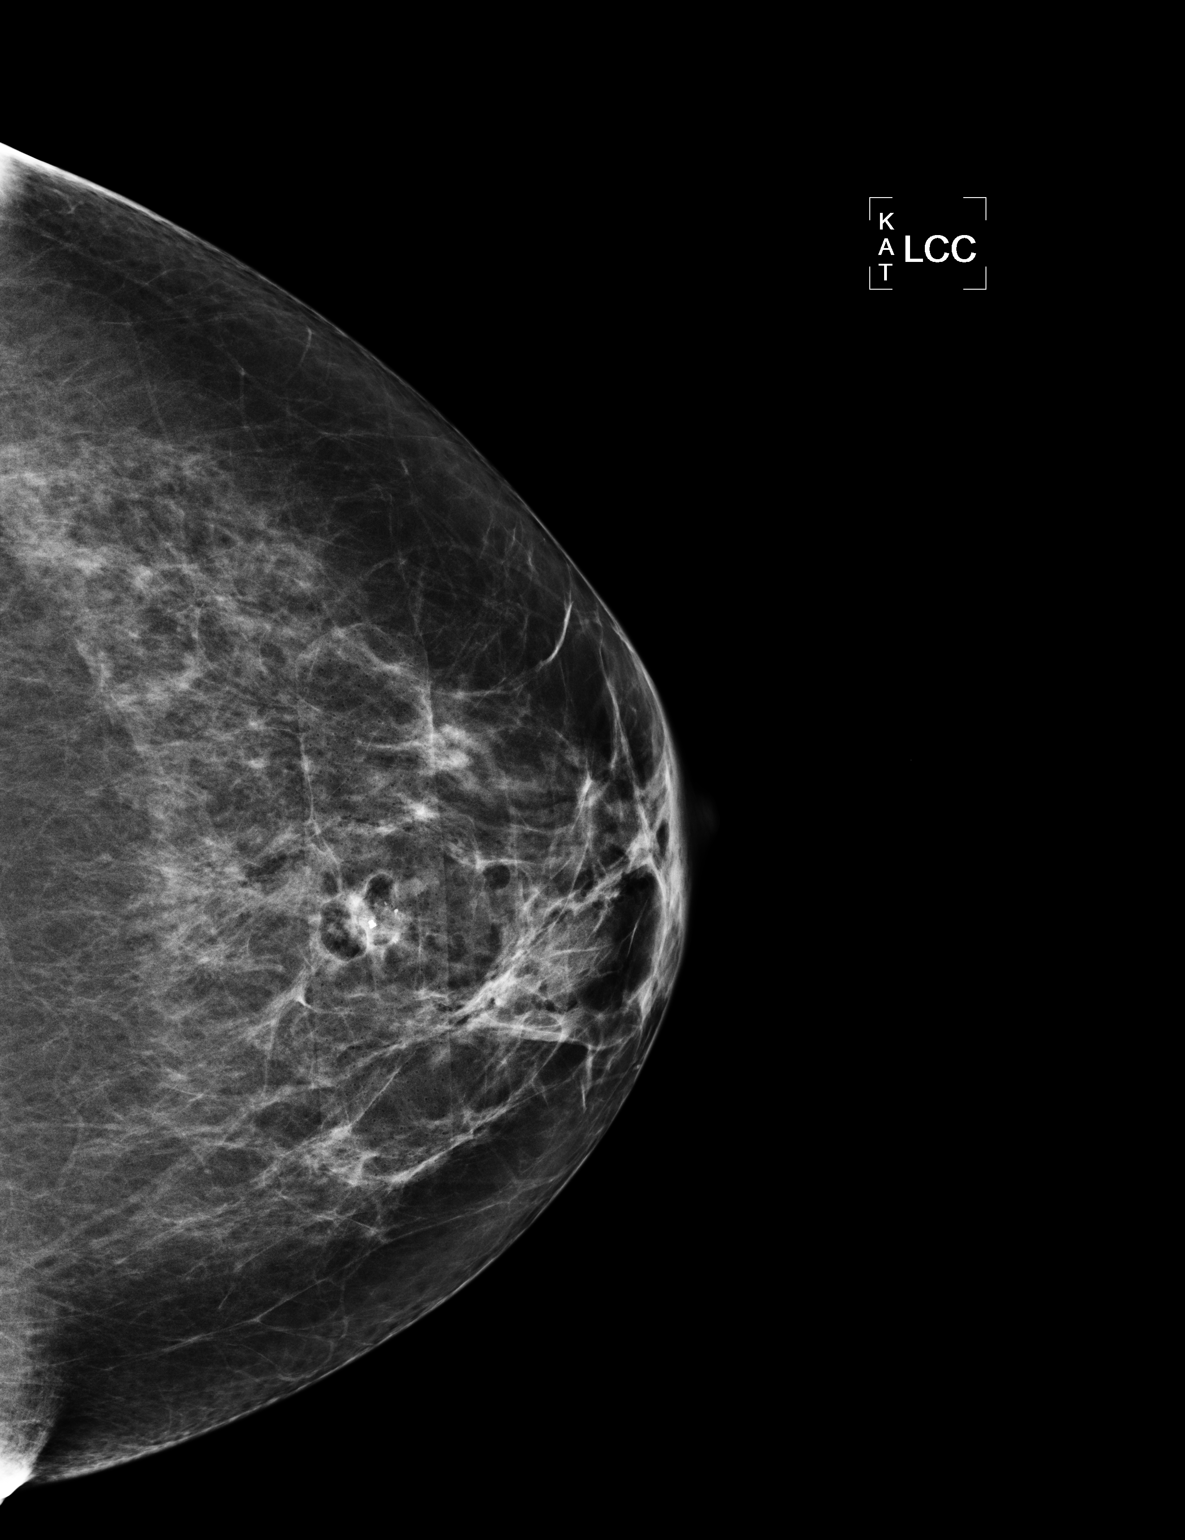

[L ML]
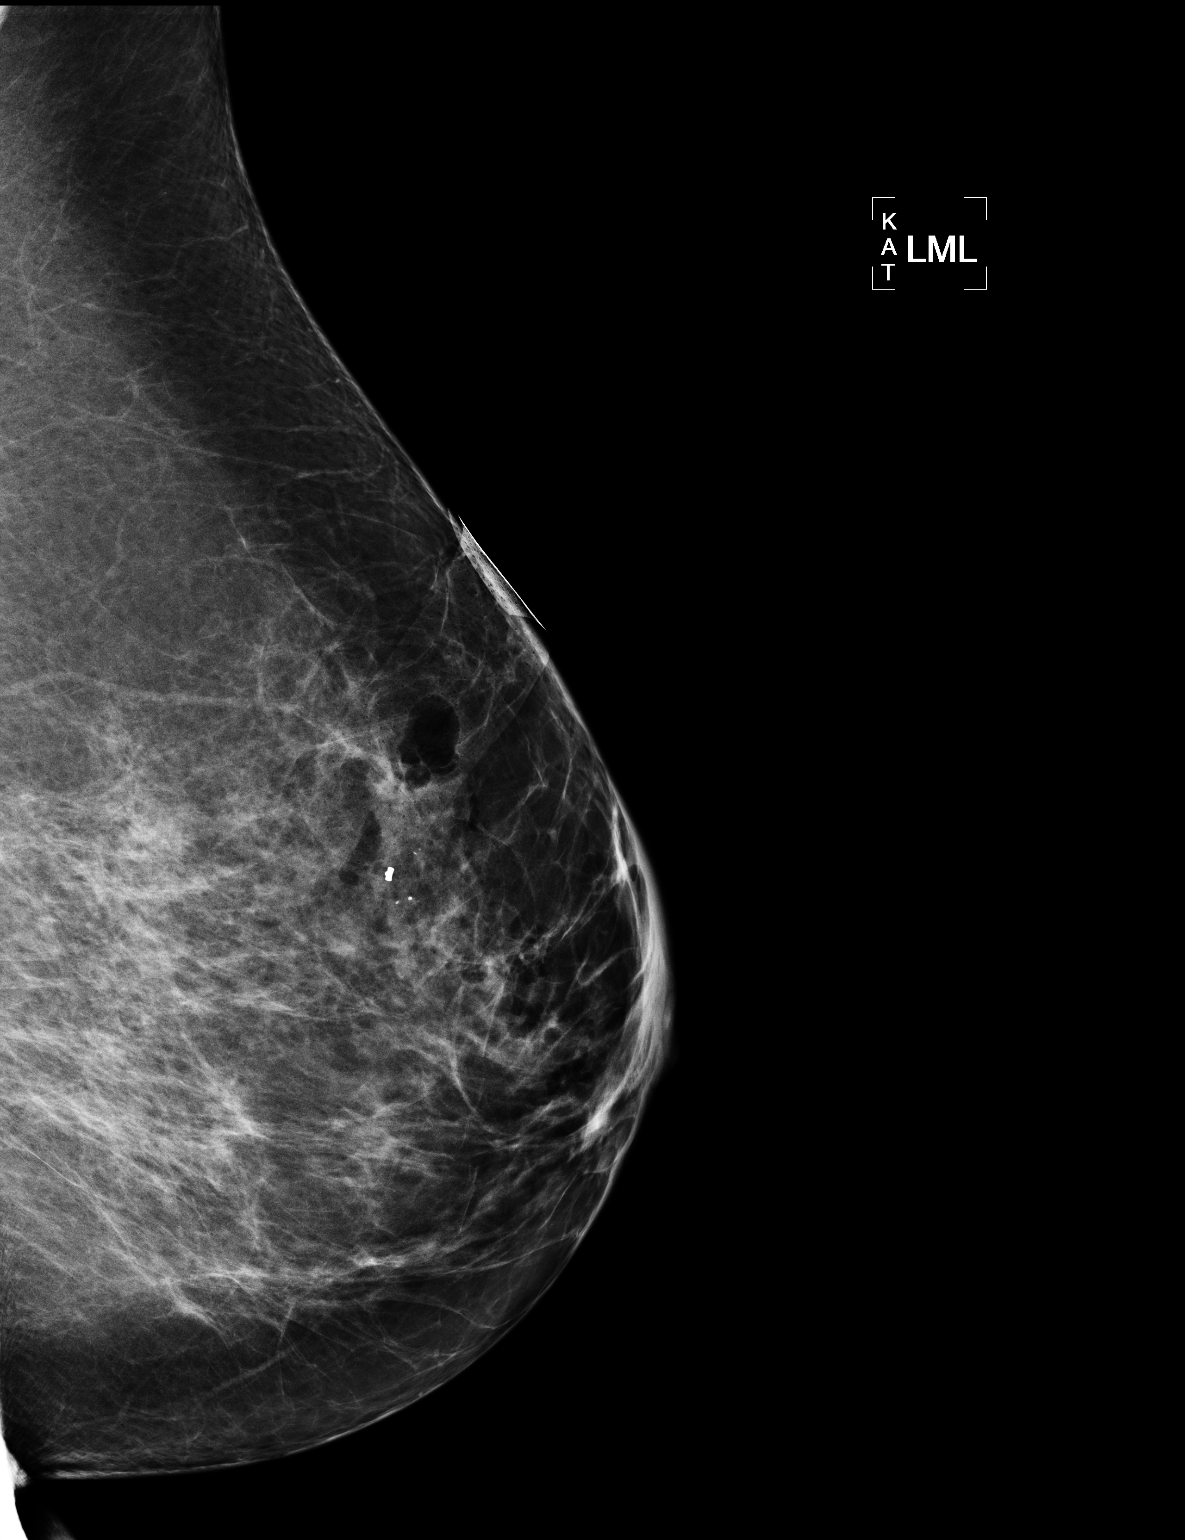

[2 of 2 positions shown; findings below may reference images not displayed]

STEREOTACTIC-GUIDED VACUUM ASSISTED BIOPSY OF THE LEFT BREAST AND
SPECIMEN RADIOGRAPH

I met with the patient, and we discussed the procedure of
stereotactic-guided biopsy, including risks, benefits, and
alternatives.  Specifically, we discussed the risks of infection,
bleeding, tissue injury, clip migration, and inadequate sampling.
Informed, written consent was given.

Using sterile technique, 2% lidocaine, stereotactic guidance, and a
9 gauge vacuum assisted device,and  a superior to inferior
approach, biopsy was performed of a cluster of calcifications in
the upper central left breast.  Specimen radiograph was performed,
showing numerous calcifications within several of the cores.
Specimens with calcifications are identified for pathology.

At the conclusion of the procedure, a titanium tissue marker clip
was deployed into the biopsy cavity.  Follow-up 2-view mammogram
confirmed clip the tissue marker clip to be in satisfactory
position.  There are a few residual calcifications adjacent to the
clip.
IMPRESSION: Stereotactic-guided biopsy of left breast.  No apparent
complications.

## 2012-05-03 ENCOUNTER — Ambulatory Visit
Admission: RE | Admit: 2012-05-03 | Discharge: 2012-05-03 | Disposition: A | Discharge status: Home / Self Care | Payer: BC Managed Care – PPO | Source: Ambulatory Visit | Attending: Obstetrics and Gynecology | Admitting: Obstetrics and Gynecology

## 2012-05-03 DIAGNOSIS — Z1231 Encounter for screening mammogram for malignant neoplasm of breast: Secondary | ICD-10-CM

## 2012-06-11 ENCOUNTER — Other Ambulatory Visit: Payer: Self-pay | Admitting: Internal Medicine

## 2012-06-23 HISTORY — PX: COLONOSCOPY: SHX174

## 2012-07-15 ENCOUNTER — Encounter: Payer: Self-pay | Admitting: Internal Medicine

## 2012-08-09 ENCOUNTER — Encounter: Payer: Self-pay | Admitting: Internal Medicine

## 2012-08-09 ENCOUNTER — Ambulatory Visit (AMBULATORY_SURGERY_CENTER): Payer: BC Managed Care – PPO | Admitting: *Deleted

## 2012-08-09 VITALS — Ht 67.5 in | Wt 182.6 lb

## 2012-08-09 DIAGNOSIS — Z1211 Encounter for screening for malignant neoplasm of colon: Secondary | ICD-10-CM

## 2012-08-09 DIAGNOSIS — K219 Gastro-esophageal reflux disease without esophagitis: Secondary | ICD-10-CM

## 2012-08-09 DIAGNOSIS — R1310 Dysphagia, unspecified: Secondary | ICD-10-CM

## 2012-08-09 MED ORDER — MOVIPREP 100 G PO SOLR: ORAL | Status: DC

## 2012-08-25 ENCOUNTER — Ambulatory Visit (AMBULATORY_SURGERY_CENTER): Payer: BC Managed Care – PPO | Admitting: Internal Medicine

## 2012-08-25 ENCOUNTER — Encounter: Payer: Self-pay | Admitting: Internal Medicine

## 2012-08-25 VITALS — BP 130/64 | HR 56 | Temp 97.4°F | Resp 16 | Ht 67.0 in | Wt 182.0 lb

## 2012-08-25 DIAGNOSIS — D126 Benign neoplasm of colon, unspecified: Secondary | ICD-10-CM

## 2012-08-25 DIAGNOSIS — K219 Gastro-esophageal reflux disease without esophagitis: Secondary | ICD-10-CM

## 2012-08-25 DIAGNOSIS — R1319 Other dysphagia: Secondary | ICD-10-CM

## 2012-08-25 DIAGNOSIS — K222 Esophageal obstruction: Secondary | ICD-10-CM

## 2012-08-25 DIAGNOSIS — Z1211 Encounter for screening for malignant neoplasm of colon: Secondary | ICD-10-CM

## 2012-08-25 MED ORDER — SODIUM CHLORIDE 0.9 % IV SOLN: 500.0000 mL | INTRAVENOUS | Status: DC

## 2012-08-25 NOTE — Op Note (Signed)
[facility] [address] [place], [address]   COLONOSCOPY PROCEDURE REPORT  PATIENT: Molly, Erickson  MR#: [id number] BIRTHDATE: 1950-10-24 , 61  yrs. old GENDER: Female ENDOSCOPIST: Hart Carwin, MD REFERRED BY:  Marga Melnick, M.D. PROCEDURE DATE:  08/25/2012 PROCEDURE:   Colonoscopy with cold biopsy polypectomy ASA CLASS:   Class II INDICATIONS:Average risk patient for colon cancer and normal colonoscopy 2004. MEDICATIONS: MAC sedation, administered by CRNA and propofol (Diprivan) 100mg  IV  DESCRIPTION OF PROCEDURE:   After the risks and benefits and of the procedure were explained, informed consent was obtained.  A digital rectal exam revealed no abnormalities of the rectum.    The LB [id number]-[id number]  endoscope was introduced through the anus and advanced to the cecum, which was identified by both the appendix and ileocecal valve .  The quality of the prep was good, using MoviPrep .  The instrument was then slowly withdrawn as the colon was fully examined.     COLON FINDINGS: A smooth sessile polyp ranging between 3-79mm in size was found in the descending colon.  A polypectomy was performed with cold forceps.  The resection was complete and the polyp tissue was completely retrieved.     Retroflexed views revealed no abnormalities.     The scope was then withdrawn from the patient and the procedure completed.  COMPLICATIONS: There were no complications. ENDOSCOPIC IMPRESSION: Sessile polyp ranging between 3-97mm in size was found in the descending colon; polypectomy was performed with cold forceps  RECOMMENDATIONS: 1.  Await pathology results 2.  High fiber diet   REPEAT EXAM: In 10 year(s)  for Colonoscopy.  cc:  _______________________________ eSignedHart Carwin, MD 08/25/2012 11:15 AM     PATIENT NAME:  Molly, Erickson MR#: [id number]

## 2012-08-25 NOTE — Patient Instructions (Addendum)
YOU HAD AN ENDOSCOPIC PROCEDURE TODAY AT THE [facility]: Refer to the procedure report that was given to you for any specific questions about what was found during the examination.  If the procedure report does not answer your questions, please call your gastroenterologist to clarify.  If you requested that your care partner not be given the details of your procedure findings, then the procedure report has been included in a sealed envelope for you to review at your convenience later.  YOU SHOULD EXPECT: Some feelings of bloating in the abdomen. Passage of more gas than usual.  Walking can help get rid of the air that was put into your GI tract during the procedure and reduce the bloating. If you had a lower endoscopy (such as a colonoscopy or flexible sigmoidoscopy) you may notice spotting of blood in your stool or on the toilet paper. If you underwent a bowel prep for your procedure, then you may not have a normal bowel movement for a few days.  DIET: See Dilation diet-   Drink plenty of fluids but you should avoid alcoholic beverages for 24 hours.  ACTIVITY: Your care partner should take you home directly after the procedure.  You should plan to take it easy, moving slowly for the rest of the day.  You can resume normal activity the day after the procedure however you should NOT DRIVE or use heavy machinery for 24 hours (because of the sedation medicines used during the test).    SYMPTOMS TO REPORT IMMEDIATELY: A gastroenterologist can be reached at any hour.  During normal business hours, 8:30 AM to 5:00 PM Monday through Friday, call [phone].  After hours and on weekends, please call the GI answering service at [phone] who will take a message and have the physician on call contact you.   Following lower endoscopy (colonoscopy or flexible sigmoidoscopy):  Excessive amounts of blood in the stool  Significant tenderness or worsening of abdominal pains  Swelling of the  abdomen that is new, acute  Fever of 100F or higher  Following upper endoscopy (EGD)  Vomiting of blood or coffee ground material  New chest pain or pain under the shoulder blades  Painful or persistently difficult swallowing  New shortness of breath  Fever of 100F or higher  Black, tarry-looking stools  FOLLOW UP: If any biopsies were taken you will be contacted by phone or by letter within the next 1-3 weeks.  Call your gastroenterologist if you have not heard about the biopsies in 3 weeks.  Our staff will call the home number listed on your records the next business day following your procedure to check on you and address any questions or concerns that you may have at that time regarding the information given to you following your procedure. This is a courtesy call and so if there is no answer at the home number and we have not heard from you through the emergency physician on call, we will assume that you have returned to your regular daily activities without incident.  SIGNATURES/CONFIDENTIALITY: You and/or your care partner have signed paperwork which will be entered into your electronic medical record.  These signatures attest to the fact that that the information above on your After Visit Summary has been reviewed and is understood.  Full responsibility of the confidentiality of this discharge information lies with you and/or your care-partner.  Dilation diet-handout given  Polyp-handout given  Hiatal hernia-handout given  Stricture-handout given  Anti-reflux regimen  Continue nexium  High Fiber diet-handout given  Repeat colonoscopy in 10 years

## 2012-08-25 NOTE — Progress Notes (Signed)
Called to room to assist during endoscopic procedure.  Patient ID and intended procedure confirmed with present staff. Received instructions for my participation in the procedure from the performing physician.  

## 2012-08-25 NOTE — Op Note (Signed)
[facility] [address] [place], [address]   ENDOSCOPY PROCEDURE REPORT  PATIENT: Molly Erickson, Molly Erickson  MR#: [id number] BIRTHDATE: 09-Dec-1950 , 61  yrs. old GENDER: Female ENDOSCOPIST: Hart Carwin, MD REFERRED BY:  Marga Melnick, M.D. PROCEDURE DATE:  08/25/2012 PROCEDURE:  EGD, diagnostic and Savary dilation of esophagus ASA CLASS:     Class II INDICATIONS:  Dysphagia.   es.  stricture 2002 dilated with 44-34F Maloney dil. MEDICATIONS: MAC sedation, administered by CRNA and propofol (Diprivan) 200mg  IV TOPICAL ANESTHETIC: none  DESCRIPTION OF PROCEDURE: After the risks benefits and alternatives of the procedure were thoroughly explained, informed consent was obtained.  The LB GIF-H180 [id number] endoscope was introduced through the mouth and advanced to the second portion of the duodenum. Without limitations.  The instrument was slowly withdrawn as the mucosa was fully examined.        ESOPHAGUS: A stricture was found at the gastroesophageal junction. The stenosis was traversable with the endoscope.   A 2 cm hiatal hernia was noted.   Savary dilators 14,15,16 and  mm  passsed over a quidewire without difficulty, thetre small amount of blood on the last dilator  STOMACH: The mucosa of the stomach appeared normal.  DUODENUM: The duodenal mucosa showed no abnormalities.  Retroflexed views revealed no abnormalities.     The scope was then withdrawn from the patient and the procedure completed.  COMPLICATIONS: There were no complications. ENDOSCOPIC IMPRESSION: 1.   Stricture was found at the gastroesophageal junction 2.   2 cm hiatal hernia  with  Sheria Lang erosion 3.   The mucosa of the stomach appeared normal 4.   The duodenal mucosa showed no abnormalities 5. passage of 14,15 and 16 mm Savary dilators  RECOMMENDATIONS: 1.  Anti-reflux regimen to be follow 2.  Continue PPI  REPEAT EXAM: no recall  eSigned:  Hart Carwin, MD 08/25/2012 11:11  AM   CC:  PATIENT NAME:  Molly Erickson, Molly Erickson MR#: [id number]

## 2012-08-25 NOTE — Progress Notes (Signed)
Patient did not experience any of the following events: a burn prior to discharge; a fall within the facility; wrong site/side/patient/procedure/implant event; or a hospital transfer or hospital admission upon discharge from the facility. (G8907) Patient did not have preoperative order for IV antibiotic SSI prophylaxis. (G8918)  

## 2012-08-25 NOTE — Progress Notes (Signed)
Report to pacu rn, vss, bbs=clear 

## 2012-08-26 ENCOUNTER — Telehealth: Payer: Self-pay | Admitting: *Deleted

## 2012-08-26 NOTE — Telephone Encounter (Signed)
  Follow up Call-  Call back number 08/25/2012  Post procedure Call Back phone  # [phone]  Permission to leave phone message Yes     Patient questions:  Do you have a fever, pain , or abdominal swelling? no Pain Score  0 *  Have you tolerated food without any problems? yes  Have you been able to return to your normal activities? yes  Do you have any questions about your discharge instructions: Diet   no Medications  no Follow up visit  no  Do you have questions or concerns about your Care? no  Actions: * If pain score is 4 or above: No action needed, pain <4.

## 2012-08-26 NOTE — Telephone Encounter (Deleted)
  Follow up Call-  Call back number 08/25/2012  Post procedure Call Back phone  # [phone]  Permission to leave phone message Yes     Patient questions:  Do you have a fever, pain , or abdominal swelling? {yes no:314532} Pain Score  {NUMBERS; 0-10:5044} *  Have you tolerated food without any problems? {yes no:314532}  Have you been able to return to your normal activities? {yes no:314532}  Do you have any questions about your discharge instructions: Diet   {yes no:314532} Medications  {yes no:314532} Follow up visit  {yes no:314532}  Do you have questions or concerns about your Care? {yes no:314532}  Actions: * If pain score is 4 or above: {ACTION; [facility] ENDO PAIN >4:21563::"No action needed, pain <4."}

## 2012-08-26 NOTE — Telephone Encounter (Signed)
Duplicate telephone call, did not call pt, opened encounter by mistake

## 2012-08-30 ENCOUNTER — Encounter: Payer: Self-pay | Admitting: Internal Medicine

## 2013-03-30 ENCOUNTER — Encounter: Payer: Self-pay | Admitting: Internal Medicine

## 2013-06-20 ENCOUNTER — Other Ambulatory Visit: Payer: Self-pay | Admitting: *Deleted

## 2013-06-20 MED ORDER — ACYCLOVIR 200 MG PO CAPS: 200.0000 mg | ORAL_CAPSULE | Freq: Every day | ORAL | Status: DC

## 2013-06-27 ENCOUNTER — Other Ambulatory Visit: Payer: Self-pay | Admitting: Internal Medicine

## 2013-06-27 NOTE — Telephone Encounter (Signed)
Nexium refill refused because patient needs an appt. Last seen March 2013. JG//CMA

## 2013-07-01 ENCOUNTER — Other Ambulatory Visit: Payer: Self-pay | Admitting: Internal Medicine

## 2013-07-04 NOTE — Telephone Encounter (Signed)
Refill request for Nexium refused because patient needs an appt. Last OV was 08/2011. JG//CMA

## 2013-09-15 ENCOUNTER — Other Ambulatory Visit: Payer: BC Managed Care – PPO

## 2013-09-15 ENCOUNTER — Ambulatory Visit (INDEPENDENT_AMBULATORY_CARE_PROVIDER_SITE_OTHER): Payer: BC Managed Care – PPO | Admitting: Internal Medicine

## 2013-09-15 ENCOUNTER — Encounter: Payer: Self-pay | Admitting: Internal Medicine

## 2013-09-15 VITALS — BP 120/80 | HR 95 | Temp 98.1°F | Resp 14 | Ht 67.5 in | Wt 178.0 lb

## 2013-09-15 DIAGNOSIS — M549 Dorsalgia, unspecified: Secondary | ICD-10-CM

## 2013-09-15 LAB — POCT URINALYSIS DIPSTICK
Bilirubin, UA: NEGATIVE
GLUCOSE UA: NEGATIVE
KETONES UA: NEGATIVE
Nitrite, UA: NEGATIVE
Protein, UA: NEGATIVE
Urobilinogen, UA: 0.2
pH, UA: 5

## 2013-09-15 NOTE — Patient Instructions (Addendum)
Stay well hydrated. Drink to thirst up to 40 ounces of fluids daily. [facility] on  [address]  [phone]

## 2013-09-15 NOTE — Progress Notes (Signed)
Pre visit review using our clinic review tool, if applicable. No additional management support is needed unless otherwise documented below in the visit note. 

## 2013-09-15 NOTE — Progress Notes (Signed)
   Subjective:    Patient ID: Molly Erickson, female    DOB: 12-23-1950, 63 y.o.   MRN: [id number]  HPI  She developed left lumbosacral/flank pain 09/12/13 after an 8 hour flight from [place]. She questions whether this is simply musculoskeletal pain which was positional. Her concern is that she's had urinary tract infections in the past without associated symptoms in the context of being on an immunosuppressant. Pain treated with back stretches with / benefit.  The pain is described as dull and constant. It is nonradiating.      Review of Systems Despite the prolonged flight; she denies any pedal edema. She also denies any calf pain, chest pain, shortness of breath  She's had no fever, chills, or sweats.  She denies dysuria, pyuria, or hematuria.     Objective:   Physical Exam General appearance is one of good health and nourishment w/o distress.  Eyes: No conjunctival inflammation or scleral icterus is present.  Oral exam: Dental hygiene is good; lips and gums are healthy appearing.There is no oropharyngeal erythema or exudate noted.   Heart:  Normal rate and regular rhythm. S1 and S2 normal without gallop, murmur, click, rub or other extra sounds . Prominent L carotid body   Lungs:Chest clear to auscultation; no wheezes, rhonchi,rales ,or rubs present.No increased work of breathing.   Abdomen: bowel sounds normal, soft and non-tender without masses, organomegaly or hernias noted.  No guarding or rebound . No tenderness over the flanks to percussion  Musculoskeletal: Able to lie flat and sit up without help. Negative straight leg raising bilaterally. Gait normal including tip toe & heel walk  Skin:Warm & dry.  Intact without suspicious lesions or rashes ; no jaundice or tenting  Lymphatic: No lymphadenopathy is noted about the head, neck, axilla, or inguinal areas.                 Assessment & Plan:  #1 L flank pain Plan: C&S  Pain meds declined

## 2013-09-17 LAB — URINE CULTURE

## 2013-12-06 ENCOUNTER — Other Ambulatory Visit: Payer: Self-pay | Admitting: Nurse Practitioner

## 2013-12-06 DIAGNOSIS — E041 Nontoxic single thyroid nodule: Secondary | ICD-10-CM

## 2013-12-10 ENCOUNTER — Emergency Department (HOSPITAL_COMMUNITY)
Admission: EM | Admit: 2013-12-10 | Discharge: 2013-12-10 | Disposition: A | Discharge status: Home / Self Care | Payer: BC Managed Care – PPO | Source: Home / Self Care | Attending: Family Medicine | Admitting: Family Medicine

## 2013-12-10 ENCOUNTER — Encounter (HOSPITAL_COMMUNITY): Payer: Self-pay | Admitting: Emergency Medicine

## 2013-12-10 ENCOUNTER — Emergency Department (INDEPENDENT_AMBULATORY_CARE_PROVIDER_SITE_OTHER): Payer: BC Managed Care – PPO

## 2013-12-10 DIAGNOSIS — J029 Acute pharyngitis, unspecified: Secondary | ICD-10-CM

## 2013-12-10 DIAGNOSIS — J069 Acute upper respiratory infection, unspecified: Secondary | ICD-10-CM

## 2013-12-10 LAB — POCT RAPID STREP A: Streptococcus, Group A Screen (Direct): NEGATIVE

## 2013-12-10 MED ORDER — LEVOFLOXACIN 500 MG PO TABS: 500.0000 mg | ORAL_TABLET | Freq: Every day | ORAL | Status: DC

## 2013-12-10 MED ORDER — PREDNISONE 10 MG PO TABS: ORAL_TABLET | ORAL | Status: DC

## 2013-12-10 NOTE — ED Notes (Signed)
Chest , ear, throat pain onset Monday 6/15

## 2013-12-10 NOTE — Discharge Instructions (Signed)
Pharyngitis °Pharyngitis is redness, pain, and swelling (inflammation) of your pharynx.  °CAUSES  °Pharyngitis is usually caused by infection. Most of the time, these infections are from viruses (viral) and are part of a cold. However, sometimes pharyngitis is caused by bacteria (bacterial). Pharyngitis can also be caused by allergies. Viral pharyngitis may be spread from person to person by coughing, sneezing, and personal items or utensils (cups, forks, spoons, toothbrushes). Bacterial pharyngitis may be spread from person to person by more intimate contact, such as kissing.  °SIGNS AND SYMPTOMS  °Symptoms of pharyngitis include:   °· Sore throat.   °· Tiredness (fatigue).   °· Low-grade fever.   °· Headache. °· Joint pain and muscle aches. °· Skin rashes. °· Swollen lymph nodes. °· Plaque-like film on throat or tonsils (often seen with bacterial pharyngitis). °DIAGNOSIS  °Your health care provider will ask you questions about your illness and your symptoms. Your medical history, along with a physical exam, is often all that is needed to diagnose pharyngitis. Sometimes, a rapid strep test is done. Other lab tests may also be done, depending on the suspected cause.  °TREATMENT  °Viral pharyngitis will usually get better in 3-4 days without the use of medicine. Bacterial pharyngitis is treated with medicines that kill germs (antibiotics).  °HOME CARE INSTRUCTIONS  °· Drink enough water and fluids to keep your urine clear or pale yellow.   °· Only take over-the-counter or prescription medicines as directed by your health care provider:   °¨ If you are prescribed antibiotics, make sure you finish them even if you start to feel better.   °¨ Do not take aspirin.   °· Get lots of rest.   °· Gargle with 8 oz of salt water (½ tsp of salt per 1 qt of water) as often as every 1-2 hours to soothe your throat.   °· Throat lozenges (if you are not at risk for choking) or sprays may be used to soothe your throat. °SEEK MEDICAL  CARE IF:  °· You have large, tender lumps in your neck. °· You have a rash. °· You cough up green, yellow-brown, or bloody spit. °SEEK IMMEDIATE MEDICAL CARE IF:  °· Your neck becomes stiff. °· You drool or are unable to swallow liquids. °· You vomit or are unable to keep medicines or liquids down. °· You have severe pain that does not go away with the use of recommended medicines. °· You have trouble breathing (not caused by a stuffy nose). °MAKE SURE YOU:  °· Understand these instructions. °· Will watch your condition. °· Will get help right away if you are not doing well or get worse. °Document Released: 06/09/2005 Document Revised: 03/30/2013 Document Reviewed: 02/14/2013 °Exit[employer]® Patient Information ©2015 [employer]. This information is not intended to replace advice given to you by your health care provider. Make sure you discuss any questions you have with your health care provider. ° °Upper Respiratory Infection, Adult °An upper respiratory infection (URI) is also sometimes known as the common cold. The upper respiratory tract includes the nose, sinuses, throat, trachea, and bronchi. Bronchi are the airways leading to the lungs. Most people improve within 1 week, but symptoms can last up to 2 weeks. A residual cough may last even longer.  °CAUSES °Many different viruses can infect the tissues lining the upper respiratory tract. The tissues become irritated and inflamed and often become very moist. Mucus production is also common. A cold is contagious. You can easily spread the virus to others by oral contact. This includes kissing, sharing a glass,   coughing, or sneezing. Touching your mouth or nose and then touching a surface, which is then touched by another person, can also spread the virus. °SYMPTOMS  °Symptoms typically develop 1 to 3 days after you come in contact with a cold virus. Symptoms vary from person to person. They may include: °· Runny nose. °· Sneezing. °· Nasal congestion. °· Sinus  irritation. °· Sore throat. °· Loss of voice (laryngitis). °· Cough. °· Fatigue. °· Muscle aches. °· Loss of appetite. °· Headache. °· Low-grade fever. °DIAGNOSIS  °You might diagnose your own cold based on familiar symptoms, since most people get a cold 2 to 3 times a year. Your caregiver can confirm this based on your exam. Most importantly, your caregiver can check that your symptoms are not due to another disease such as strep throat, sinusitis, pneumonia, asthma, or epiglottitis. Blood tests, throat tests, and X-rays are not necessary to diagnose a common cold, but they may sometimes be helpful in excluding other more serious diseases. Your caregiver will decide if any further tests are required. °RISKS AND COMPLICATIONS  °You may be at risk for a more severe case of the common cold if you smoke cigarettes, have chronic heart disease (such as heart failure) or lung disease (such as asthma), or if you have a weakened immune system. The very young and very old are also at risk for more serious infections. Bacterial sinusitis, middle ear infections, and bacterial pneumonia can complicate the common cold. The common cold can worsen asthma and chronic obstructive pulmonary disease (COPD). Sometimes, these complications can require emergency medical care and may be life-threatening. °PREVENTION  °The best way to protect against getting a cold is to practice good hygiene. Avoid oral or hand contact with people with cold symptoms. Wash your hands often if contact occurs. There is no clear evidence that vitamin C, vitamin E, echinacea, or exercise reduces the chance of developing a cold. However, it is always recommended to get plenty of rest and practice good nutrition. °TREATMENT  °Treatment is directed at relieving symptoms. There is no cure. Antibiotics are not effective, because the infection is caused by a virus, not by bacteria. Treatment may include: °· Increased fluid intake. Sports drinks offer valuable  electrolytes, sugars, and fluids. °· Breathing heated mist or steam (vaporizer or shower). °· Eating chicken soup or other clear broths, and maintaining good nutrition. °· Getting plenty of rest. °· Using gargles or lozenges for comfort. °· Controlling fevers with ibuprofen or acetaminophen as directed by your caregiver. °· Increasing usage of your inhaler if you have asthma. °Zinc gel and zinc lozenges, taken in the first 24 hours of the common cold, can shorten the duration and lessen the severity of symptoms. Pain medicines may help with fever, muscle aches, and throat pain. A variety of non-prescription medicines are available to treat congestion and runny nose. Your caregiver can make recommendations and may suggest nasal or lung inhalers for other symptoms.  °HOME CARE INSTRUCTIONS  °· Only take over-the-counter or prescription medicines for pain, discomfort, or fever as directed by your caregiver. °· Use a warm mist humidifier or inhale steam from a shower to increase air moisture. This may keep secretions moist and make it easier to breathe. °· Drink enough water and fluids to keep your urine clear or pale yellow. °· Rest as needed. °· Return to work when your temperature has returned to normal or as your caregiver advises. You may need to stay home longer to avoid infecting others. You   can also use a face mask and careful hand washing to prevent spread of the virus. SEEK MEDICAL CARE IF:   After the first few days, you feel you are getting worse rather than better.  You need your caregiver's advice about medicines to control symptoms.  You develop chills, worsening shortness of breath, or brown or red sputum. These may be signs of pneumonia.  You develop yellow or brown nasal discharge or pain in the face, especially when you bend forward. These may be signs of sinusitis.  You develop a fever, swollen neck glands, pain with swallowing, or white areas in the back of your throat. These may be signs  of strep throat. SEEK IMMEDIATE MEDICAL CARE IF:   You have a fever.  You develop severe or persistent headache, ear pain, sinus pain, or chest pain.  You develop wheezing, a prolonged cough, cough up blood, or have a change in your usual mucus (if you have chronic lung disease).  You develop sore muscles or a stiff neck. Document Released: 12/03/2000 Document Revised: 09/01/2011 Document Reviewed: 10/11/2010 [facility] Patient Information 2015 [place], [place]. This information is not intended to replace advice given to you by your health care provider. Make sure you discuss any questions you have with your health care provider.

## 2013-12-10 NOTE — ED Provider Notes (Signed)
CSN: [id number]     Arrival date & time 12/10/13  1012 History   First MD Initiated Contact with Patient 12/10/13 1132     Chief Complaint  Patient presents with  . Chest Pain  . Sore Throat   (Consider location/radiation/quality/duration/timing/severity/associated sxs/prior Treatment) HPI Comments: 63 year old female with history of Wegener's granulomatosis and relapsing polychondritis, on Imuran for immunosuppression, presents complaining of sore throat and chest tightness/congestion. This started a few days ago when she was on vacation at the beach. Her symptoms were getting worse, and she went to an emergency department on Wednesday, 2 days ago. At that time, she was placed on prednisone and Ceftin. Since then, her difficulty with swallowing has gotten better but her throat is still sore, she still has the swollen lymph nodes, and she still has a cough and chest tightness. She denies fever, chills, NVD, abdominal pain, shortness of breath. Her cough is nonproductive. No recent travel or sick contacts. She is about to visit her brother who has stage IV cancer, she wants to make sure it is okay for her to go.  Patient is a 63 y.o. female presenting with chest pain and pharyngitis.  Chest Pain Associated symptoms: cough   Associated symptoms: no shortness of breath   Sore Throat Associated symptoms include chest pain. Pertinent negatives include no shortness of breath.    Past Medical History  Diagnosis Date  . Wegener's syndrome   . GERD (gastroesophageal reflux disease)   . Hypothyroidism   . Hyperlipidemia   . Osteopenia   . CIN I (cervical intraepithelial neoplasia I)   . Relapsing polychondritis   . Bronchitis   . Fever blister   . Cancer 06/2011    Basal cell, Dr Edison Pace , [place]  . Hematuria    Past Surgical History  Procedure Laterality Date  . Lung biopsy    . Esophageal dilation      x 1  . Breast biopsy      Papilloma-Intraductal  . Breast lumpectomy      benign   . Colposcopy     Family History  Problem Relation Age of Onset  . Coronary artery disease Father   . Hypertension Father   . Prostate cancer Father   . Skin cancer Father   . Heart disease Father   . Breast cancer Mother   . Leukemia Mother   . Breast cancer Maternal Aunt   . Diabetes Daughter   . Colon cancer Neg Hx    History  Substance Use Topics  . Smoking status: Never Smoker   . Smokeless tobacco: Never Used  . Alcohol Use: 2.0 oz/week    4 drink(s) per week     Comment: socially   OB History   Grav Para Term Preterm Abortions TAB SAB Ect Mult Living   2 2 2       2      Review of Systems  HENT: Positive for ear pain (on the right) and sore throat. Negative for congestion and rhinorrhea.   Respiratory: Positive for cough and chest tightness. Negative for shortness of breath and wheezing.   Cardiovascular: Positive for chest pain.  Hematological: Positive for adenopathy.    Allergies  Review of patient's allergies indicates no known allergies.  Home Medications   Prior to Admission medications   Medication Sig Start Date End Date Taking? Authorizing Provider  OVER THE COUNTER MEDICATION Antibiotic and prednisone -started Wednesday 6/17   Yes Historical Provider, MD  acyclovir (ZOVIRAX) 200 MG  capsule Take 1 capsule (200 mg total) by mouth 5 (five) times daily. 06/20/13   Hendricks Limes, MD  ACYCLOVIR PO Take 200 mg by mouth as needed.     Historical Provider, MD  azaTHIOprine (IMURAN) 50 MG tablet Take 50 mg by mouth 2 (two) times daily.     Historical Provider, MD  Calcium Citrate-Vitamin D (CALCIUM CITRATE + D PO) Take by mouth.      Historical Provider, MD  Cholecalciferol (VITAMIN D-3) 5000 UNITS TABS Take by mouth daily.    Historical Provider, MD  Famotidine (PEPCID PO) Take by mouth as needed.    Historical Provider, MD  levofloxacin (LEVAQUIN) 500 MG tablet Take 1 tablet (500 mg total) by mouth daily. 12/10/13   Liam Graham, PA-C  levothyroxine  (SYNTHROID) 175 MCG tablet TAKE ONE-HALF TABLET DAILY EXCEPT ONE   TABLET ON  Mon,Weds, & Fri 09/17/11   Hendricks Limes, MD  liothyronine (CYTOMEL) 5 MCG tablet Take 5 mcg by mouth daily. Take two tablets daily    Historical Provider, MD  NEXIUM 40 MG capsule TAKE ONE CAPSULE TWICE A DAY 06/11/12   Hendricks Limes, MD  predniSONE (DELTASONE) 10 MG tablet 4 tabs PO QD for 4 days; 3 tabs PO QD for 3 days; 2 tabs PO QD for 2 days; 1 tab PO QD for 1 day 12/10/13   Liam Graham, PA-C   BP 154/97  Pulse 69  Temp(Src) 98.4 F (36.9 C) (Oral)  Resp 17  SpO2 96% Physical Exam  Nursing note and vitals reviewed. Constitutional: She is oriented to person, place, and time. Vital signs are normal. She appears well-developed and well-nourished. No distress.  HENT:  Head: Normocephalic and atraumatic.  Left Ear: External ear normal.  Nose: Nose normal.  Mouth/Throat: Oropharynx is clear and moist. No oropharyngeal exudate.  Eyes: Conjunctivae are normal. Right eye exhibits no discharge. Left eye exhibits no discharge.  Neck: Normal range of motion. Neck supple.  Cardiovascular: Normal rate, regular rhythm and normal heart sounds.  Exam reveals no gallop and no friction rub.   No murmur heard. Pulmonary/Chest: Effort normal and breath sounds normal. No respiratory distress. She has no wheezes. She has no rales.  Lymphadenopathy:       Head (right side): Tonsillar adenopathy present.       Head (left side): Tonsillar adenopathy present.  Neurological: She is alert and oriented to person, place, and time. She has normal strength. Coordination normal.  Skin: Skin is warm and dry. No rash noted. She is not diaphoretic.  Psychiatric: She has a normal mood and affect. Judgment normal.    ED Course  Procedures (including critical care time) Labs Review Labs Reviewed  POCT RAPID STREP A ([facility] URG CARE ONLY)    Imaging Review Dg Chest 2 View  12/10/2013   CLINICAL DATA:  CHEST PAIN SORE THROAT CHEST  PAIN SORE THROAT  EXAM: CHEST  2 VIEW  COMPARISON:  None.  FINDINGS: Lungs hyperinflated. There is increased AP diameter chest. There is flattening of the hemidiaphragms. Postsurgical changes right hemithorax. Lungs are clear. Cardiac silhouette within the upper limits normal. No acute osseous abnormalities.  IMPRESSION: COPD.  No acute cardiopulmonary disease.   Electronically Signed   By: Margaree Mackintosh M.D.   On: 12/10/2013 12:13     MDM   1. Pharyngitis   2. URI (upper respiratory infection)    Given the patient's history and immunocompromise status, treat with Levaquin and will repeat  the prednisone dose with a taper. Followup with primary care in a few days.  New Prescriptions   LEVOFLOXACIN (LEVAQUIN) 500 MG TABLET    Take 1 tablet (500 mg total) by mouth daily.   PREDNISONE (DELTASONE) 10 MG TABLET    4 tabs PO QD for 4 days; 3 tabs PO QD for 3 days; 2 tabs PO QD for 2 days; 1 tab PO QD for 1 day       Liam Graham, PA-C 12/10/13 1229

## 2013-12-12 LAB — CULTURE, GROUP A STREP

## 2013-12-14 NOTE — ED Provider Notes (Signed)
Medical screening examination/treatment/procedure(s) were performed by a resident physician or non-physician practitioner and as the supervising physician I was immediately available for consultation/collaboration.  Lynne Leader, MD    Gregor Hams, MD 12/14/13 [phone]

## 2013-12-29 ENCOUNTER — Ambulatory Visit
Admission: RE | Admit: 2013-12-29 | Discharge: 2013-12-29 | Disposition: A | Discharge status: Home / Self Care | Payer: BC Managed Care – PPO | Source: Ambulatory Visit | Attending: Nurse Practitioner | Admitting: Nurse Practitioner

## 2013-12-29 DIAGNOSIS — E041 Nontoxic single thyroid nodule: Secondary | ICD-10-CM

## 2014-04-24 ENCOUNTER — Encounter (HOSPITAL_COMMUNITY): Payer: Self-pay | Admitting: Emergency Medicine

## 2014-12-04 LAB — HM MAMMOGRAPHY

## 2014-12-08 ENCOUNTER — Encounter: Payer: Self-pay | Admitting: Internal Medicine

## 2014-12-08 ENCOUNTER — Encounter: Payer: Self-pay | Admitting: Emergency Medicine

## 2014-12-30 ENCOUNTER — Ambulatory Visit (INDEPENDENT_AMBULATORY_CARE_PROVIDER_SITE_OTHER): Payer: PRIVATE HEALTH INSURANCE | Admitting: Family Medicine

## 2014-12-30 ENCOUNTER — Other Ambulatory Visit: Payer: Self-pay | Admitting: Family Medicine

## 2014-12-30 ENCOUNTER — Encounter: Payer: Self-pay | Admitting: Family Medicine

## 2014-12-30 ENCOUNTER — Ambulatory Visit (HOSPITAL_COMMUNITY)
Admission: RE | Admit: 2014-12-30 | Discharge: 2014-12-30 | Disposition: A | Discharge status: Home / Self Care | Payer: 59 | Source: Ambulatory Visit | Attending: Family Medicine | Admitting: Family Medicine

## 2014-12-30 VITALS — BP 122/70 | HR 98 | Temp 97.7°F | Ht 67.5 in | Wt 170.8 lb

## 2014-12-30 DIAGNOSIS — M542 Cervicalgia: Secondary | ICD-10-CM | POA: Diagnosis not present

## 2014-12-30 DIAGNOSIS — J321 Chronic frontal sinusitis: Secondary | ICD-10-CM

## 2014-12-30 DIAGNOSIS — R51 Headache: Secondary | ICD-10-CM | POA: Diagnosis not present

## 2014-12-30 DIAGNOSIS — M47892 Other spondylosis, cervical region: Secondary | ICD-10-CM | POA: Diagnosis not present

## 2014-12-30 DIAGNOSIS — J019 Acute sinusitis, unspecified: Secondary | ICD-10-CM

## 2014-12-30 MED ORDER — CYCLOBENZAPRINE HCL 5 MG PO TABS: 5.0000 mg | ORAL_TABLET | Freq: Every evening | ORAL | Status: DC | PRN

## 2014-12-30 MED ORDER — ACYCLOVIR 200 MG PO CAPS: 200.0000 mg | ORAL_CAPSULE | Freq: Every day | ORAL | Status: DC

## 2014-12-30 MED ORDER — CEFDINIR 300 MG PO CAPS: 300.0000 mg | ORAL_CAPSULE | Freq: Two times a day (BID) | ORAL | Status: AC

## 2014-12-30 NOTE — Progress Notes (Signed)
Pre visit review using our clinic review tool, if applicable. No additional management support is needed unless otherwise documented below in the visit note. 

## 2014-12-30 NOTE — Patient Instructions (Signed)

## 2014-12-31 ENCOUNTER — Encounter: Payer: Self-pay | Admitting: Family Medicine

## 2014-12-31 DIAGNOSIS — M542 Cervicalgia: Secondary | ICD-10-CM

## 2014-12-31 HISTORY — DX: Cervicalgia: M54.2

## 2014-12-31 NOTE — Assessment & Plan Note (Addendum)
Xray negative for infection. Encouraged increased rest and hydration, add probiotics, zinc such as Coldeze or Xicam. Treat fevers as needed. Report worsening symptoms, is in possession of Cefdinir rx in case her symptoms worsen or fevers develop

## 2014-12-31 NOTE — Progress Notes (Signed)
Molly Erickson  [id number] [birth year] 12/31/2014      Progress Note-Follow Up  Subjective  Chief Complaint  Chief Complaint  Patient presents with  . Migraine    past 2-3 days    HPI  Patient is a 64 y.o. female in today for routine medical care. Patient is in today to discuss numerous symptoms. Symptoms actually better today but earlier in the week about 3 days ago she had severe headache and neck pain. She had congestion and malaise. Today she reports it is 20 C better. She does have some ongoing neck pain and mild sinus pressure. She says the symptoms are similar to when she was treated for sinus infection in the past. Denies fevers or chills. Denies rhinorrhea or postnasal drip. Denies CP/palp/SOB/congestion/fevers/GI or GU c/o. Taking meds as prescribed  Past Medical History  Diagnosis Date  . Wegener's syndrome   . GERD (gastroesophageal reflux disease)   . Hypothyroidism   . Hyperlipidemia   . Osteopenia   . CIN I (cervical intraepithelial neoplasia I)   . Relapsing polychondritis   . Bronchitis   . Fever blister   . Cancer 06/2011    Basal cell, Dr Edison Pace , [place]  . Hematuria   . Neck pain 12/31/2014    Past Surgical History  Procedure Laterality Date  . Lung biopsy    . Esophageal dilation      x 1  . Breast biopsy      Papilloma-Intraductal  . Breast lumpectomy      benign  . Colposcopy      Family History  Problem Relation Age of Onset  . Coronary artery disease Father   . Hypertension Father   . Prostate cancer Father   . Skin cancer Father   . Heart disease Father   . Breast cancer Mother   . Leukemia Mother   . Breast cancer Maternal Aunt   . Diabetes Daughter   . Colon cancer Neg Hx     History   Social History  . Marital Status: Married    Spouse Name: N/A  . Number of Children: N/A  . Years of Education: N/A   Occupational History  . Not on file.   Social History Main Topics  . Smoking status: Never Smoker   . Smokeless  tobacco: Never Used  . Alcohol Use: 2.0 oz/week    4 drink(s) per week     Comment: socially  . Drug Use: No  . Sexual Activity: Yes    Birth Control/ Protection: Post-menopausal   Other Topics Concern  . Not on file   Social History Narrative    Current Outpatient Prescriptions on File Prior to Visit  Medication Sig Dispense Refill  . azaTHIOprine (IMURAN) 50 MG tablet Take 50 mg by mouth 2 (two) times daily.     . Calcium Citrate-Vitamin D (CALCIUM CITRATE + D PO) Take by mouth.      . Cholecalciferol (VITAMIN D-3) 5000 UNITS TABS Take by mouth daily.    Marland Kitchen levothyroxine (SYNTHROID) 175 MCG tablet TAKE ONE-HALF TABLET DAILY EXCEPT ONE   TABLET ON  Mon,Weds, & Fri 30 tablet 5  . liothyronine (CYTOMEL) 5 MCG tablet Take 5 mcg by mouth daily. Take two tablets daily    . NEXIUM 40 MG capsule TAKE ONE CAPSULE TWICE A DAY 60 capsule 5  . Famotidine (PEPCID PO) Take by mouth as needed.    Marland Kitchen OVER THE COUNTER MEDICATION Antibiotic and prednisone -started Wednesday 6/17    .  predniSONE (DELTASONE) 10 MG tablet 4 tabs PO QD for 4 days; 3 tabs PO QD for 3 days; 2 tabs PO QD for 2 days; 1 tab PO QD for 1 day (Patient not taking: Reported on 12/30/2014) 30 tablet 0   No current facility-administered medications on file prior to visit.    No Known Allergies  Review of Systems  Review of Systems  Constitutional: Negative for fever and malaise/fatigue.  HENT: Positive for congestion.   Eyes: Negative for blurred vision, pain and discharge.  Respiratory: Negative for shortness of breath.   Cardiovascular: Negative for chest pain, palpitations and leg swelling.  Gastrointestinal: Negative for nausea, abdominal pain and diarrhea.  Genitourinary: Negative for dysuria.  Musculoskeletal: Positive for neck pain. Negative for falls.  Skin: Negative for rash.  Neurological: Positive for headaches. Negative for loss of consciousness.  Endo/Heme/Allergies: Negative for polydipsia.    Psychiatric/Behavioral: Negative for depression and suicidal ideas. The patient is not nervous/anxious and does not have insomnia.     Objective  BP 122/70 mmHg  Pulse 98  Temp(Src) 97.7 F (36.5 C) (Oral)  Ht 5' 7.5" (1.715 m)  Wt 170 lb 12.8 oz (77.474 kg)  BMI 26.34 kg/m2  SpO2 98%  Physical Exam  Physical Exam  Constitutional: She is oriented to person, place, and time and well-developed, well-nourished, and in no distress. No distress.  HENT:  Head: Normocephalic and atraumatic.  No pain with palp over sinuses  Eyes: Conjunctivae are normal.  Neck: Neck supple. No thyromegaly present.  Cardiovascular: Normal rate, regular rhythm and normal heart sounds.   No murmur heard. Pulmonary/Chest: Effort normal and breath sounds normal. She has no wheezes.  Abdominal: She exhibits no distension and no mass.  Musculoskeletal: She exhibits no edema.  Lymphadenopathy:    She has no cervical adenopathy.  Neurological: She is alert and oriented to person, place, and time.  Skin: Skin is warm and dry. No rash noted. She is not diaphoretic.  Psychiatric: Memory, affect and judgment normal.    Lab Results  Component Value Date   TSH 3.08 09/17/2011   Lab Results  Component Value Date   WBC 5.5 08/06/2010   HGB 12.8 08/06/2010   HCT 37.3 08/06/2010   MCV 90.3 08/06/2010   PLT 274.0 08/06/2010   Lab Results  Component Value Date   CREATININE 0.9 01/07/2010   BUN 20 01/07/2010   NA 140 01/07/2010   K 4.5 01/07/2010   CL 109 01/07/2010   CO2 27 01/07/2010   Lab Results  Component Value Date   ALT 16 01/07/2010   AST 17 01/07/2010   ALKPHOS 85 01/07/2010   BILITOT 0.5 01/07/2010   Lab Results  Component Value Date   CHOL 200 04/22/2010   Lab Results  Component Value Date   HDL 43.10 04/22/2010   Lab Results  Component Value Date   LDLCALC 133* 04/22/2010   Lab Results  Component Value Date   TRIG 120.0 04/22/2010   Lab Results  Component Value Date    CHOLHDL 5 04/22/2010     Assessment & Plan  Sinusitis, acute Xray negative for infection. Encouraged increased rest and hydration, add probiotics, zinc such as Coldeze or Xicam. Treat fevers as needed. Report worsening symptoms, is in possession of Cefdinir rx in case her symptoms worsen or fevers develop  Neck pain Improved today but very severe earlier in the week with associated headache. Xray shows degenerative changes but no acute concerns. Encouraged moist heat and gentle  stretching as tolerated. May try NSAIDs and prescription meds as directed and report if symptoms worsen or seek immediate care.

## 2014-12-31 NOTE — Assessment & Plan Note (Addendum)
Improved today but very severe earlier in the week with associated headache. Xray shows degenerative changes but no acute concerns. Encouraged moist heat and gentle stretching as tolerated. May try NSAIDs and prescription meds as directed and report if symptoms worsen or seek immediate care.

## 2015-01-01 ENCOUNTER — Ambulatory Visit: Payer: Self-pay | Admitting: Internal Medicine

## 2015-07-03 ENCOUNTER — Telehealth: Payer: Self-pay | Admitting: Internal Medicine

## 2015-07-06 ENCOUNTER — Ambulatory Visit: Payer: Self-pay | Admitting: Internal Medicine

## 2015-07-12 ENCOUNTER — Other Ambulatory Visit: Payer: Self-pay | Admitting: Obstetrics and Gynecology

## 2015-07-12 DIAGNOSIS — N644 Mastodynia: Secondary | ICD-10-CM

## 2015-07-20 ENCOUNTER — Ambulatory Visit
Admission: RE | Admit: 2015-07-20 | Discharge: 2015-07-20 | Disposition: A | Discharge status: Home / Self Care | Payer: PRIVATE HEALTH INSURANCE | Source: Ambulatory Visit | Attending: Obstetrics and Gynecology | Admitting: Obstetrics and Gynecology

## 2015-07-20 ENCOUNTER — Ambulatory Visit
Admission: RE | Admit: 2015-07-20 | Discharge: 2015-07-20 | Disposition: A | Discharge status: Home / Self Care | Payer: No Typology Code available for payment source | Source: Ambulatory Visit | Attending: Obstetrics and Gynecology | Admitting: Obstetrics and Gynecology

## 2015-07-20 DIAGNOSIS — N644 Mastodynia: Secondary | ICD-10-CM

## 2015-08-22 ENCOUNTER — Ambulatory Visit: Payer: Self-pay | Admitting: Internal Medicine

## 2015-09-27 ENCOUNTER — Encounter: Payer: Self-pay | Admitting: Internal Medicine

## 2015-09-27 ENCOUNTER — Ambulatory Visit (INDEPENDENT_AMBULATORY_CARE_PROVIDER_SITE_OTHER): Payer: PRIVATE HEALTH INSURANCE | Admitting: Internal Medicine

## 2015-09-27 VITALS — BP 116/80 | HR 57 | Temp 97.9°F | Resp 16 | Wt 175.0 lb

## 2015-09-27 DIAGNOSIS — M3130 Wegener's granulomatosis without renal involvement: Secondary | ICD-10-CM

## 2015-09-27 DIAGNOSIS — E038 Other specified hypothyroidism: Secondary | ICD-10-CM | POA: Diagnosis not present

## 2015-09-27 DIAGNOSIS — K219 Gastro-esophageal reflux disease without esophagitis: Secondary | ICD-10-CM | POA: Diagnosis not present

## 2015-09-27 DIAGNOSIS — E785 Hyperlipidemia, unspecified: Secondary | ICD-10-CM

## 2015-09-27 DIAGNOSIS — M941 Relapsing polychondritis: Secondary | ICD-10-CM | POA: Diagnosis not present

## 2015-09-27 DIAGNOSIS — M810 Age-related osteoporosis without current pathological fracture: Secondary | ICD-10-CM

## 2015-09-27 NOTE — Patient Instructions (Addendum)
   Medications reviewed and updated.  No changes recommended at this time.   Please followup as needed   

## 2015-09-27 NOTE — Assessment & Plan Note (Addendum)
Went off nexium - on pepcid as needed Monitor swallowing closely given her history of stricture needing dilation 3 times. She does note occasional dysphagia. She will follow up with GI as needed

## 2015-09-27 NOTE — Assessment & Plan Note (Signed)
Management per naturolopath

## 2015-09-27 NOTE — Assessment & Plan Note (Addendum)
Taking calcium, vitamin d exercising Taking evista - prescribed by gyn

## 2015-09-27 NOTE — Progress Notes (Signed)
Pre visit review using our clinic review tool, if applicable. No additional management support is needed unless otherwise documented below in the visit note. 

## 2015-09-27 NOTE — Progress Notes (Signed)
Subjective:    Patient ID: Molly Erickson, female    DOB: [birth year], 65 y.o.   MRN: [id number]  HPI She is here to establish with a new pcp.    Hypothyroidism:  She follows with a holistic doctor for her thyroid management.  She is taking her medication daily.  She denies any recent changes in energy or weight that are unexplained.   One episode of chest pain - pressure on chest that woke her up.  It occurred in december and lasted about 30 mintues.  She has not had an recurrences.  She did take some aspirin.  She did lift weights that day and wonders if she injured herself.  She denies frequent heartburn.  She was on Nexium, but has discontinued that and seems to be doing okay. She takes Pepcid as needed. She does not recall having any heartburn but denies that she has the chest discomfort.  Wegener's, relapsing polychondritis: She is following with rheumatology. She did have recent blood work done within including a CBC and a CMP and they were normal. Her Wegener's has been in remission for years. Her relapsing polychondritis is currently controlled on her current medication.  Osteoporosis: She is following with her gynecologist and is taking calcium and vitamin D daily. She has exercising, but could do more. She is currently taking Evista.  Hyperlipidemia: She does have a history of hyperlipidemia. She has never been on medication. She knows she should probably have her last rechecked at some point in the near future.  She has no concerns or questions.   Medications and allergies reviewed with patient and updated if appropriate.  Patient Active Problem List   Diagnosis Date Noted  . Neck pain 12/31/2014  . CIN I (cervical intraepithelial neoplasia I)   . Relapsing polychondritis   . Fever blister   . Hematuria   . FATIGUE 08/09/2010  . SNORING, HX OF 08/09/2010  . BREAST BIOPSY, HX OF 01/14/2010  . Hypothyroidism 10/16/2008  . HYPERLIPIDEMIA 10/16/2008  . 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 GERD 10/16/2008  . OSTEOPOROSIS 10/16/2008    Current Outpatient Prescriptions on File Prior to Visit  Medication Sig Dispense Refill  . acyclovir (ZOVIRAX) 200 MG capsule Take 1 capsule (200 mg total) by mouth 5 (five) times daily. 25 capsule 0  . azaTHIOprine (IMURAN) 50 MG tablet Take 50 mg by mouth 2 (two) times daily.     . Cholecalciferol (VITAMIN D-3) 5000 UNITS TABS Take 25,000 Units by mouth once a week.     . Famotidine (PEPCID PO) Take by mouth as needed.     No current facility-administered medications on file prior to visit.    Past Medical History  Diagnosis Date  . Wegener's syndrome ([place])   . GERD (gastroesophageal reflux disease)   . Hypothyroidism   . Hyperlipidemia   . Osteopenia   . CIN I (cervical intraepithelial neoplasia I)   . Relapsing polychondritis   . Bronchitis   . Fever blister   . Cancer ([place]) 06/2011    Basal cell, Dr Edison Pace , [place]  . Hematuria   . Neck pain 12/31/2014  . Abscess of finger 11/15/2010    Past Surgical History  Procedure Laterality Date  . Lung biopsy    . Esophageal dilation      x 1  . Breast biopsy      Papilloma-Intraductal  . Breast lumpectomy      benign  . Colposcopy  Social History   Social History  . Marital Status: Married    Spouse Name: N/A  . Number of Children: N/A  . Years of Education: N/A   Social History Main Topics  . Smoking status: Never Smoker   . Smokeless tobacco: Never Used  . Alcohol Use: 2.0 oz/week    4 drink(s) per week     Comment: socially  . Drug Use: No  . Sexual Activity: Yes    Birth Control/ Protection: Post-menopausal   Other Topics Concern  . Not on file   Social History Narrative    Family History  Problem Relation Age of Onset  . Coronary artery disease Father   . Hypertension Father   . Prostate cancer Father   . Skin cancer Father   . Heart disease Father   . Breast cancer Mother   . Leukemia Mother   . Breast  cancer Maternal Aunt   . Diabetes Daughter   . Colon cancer Neg Hx     Review of Systems  Constitutional: Negative for fatigue.  Respiratory: Negative for cough, shortness of breath and wheezing.   Cardiovascular: Positive for chest pain (one episode 4 months ago). Negative for palpitations and leg swelling.  Gastrointestinal: Positive for constipation (mild). Negative for abdominal pain, diarrhea and blood in stool.       Objective:   Filed Vitals:   09/27/15 1046  BP: 116/80  Pulse: 57  Temp: 97.9 F (36.6 C)  Resp: 16   Filed Weights   09/27/15 1046  Weight: 175 lb (79.379 kg)   Body mass index is 26.99 kg/(m^2).   Physical Exam Constitutional: Appears well-developed and well-nourished. No distress.  Neck: Neck supple. No tracheal deviation present. No thyromegaly present.  No carotid bruit. No cervical adenopathy.   Cardiovascular: Normal rate, regular rhythm and normal heart sounds.   No murmur heard.  No edema Pulmonary/Chest: Effort normal and breath sounds normal. No respiratory distress. No wheezes.  Abdomen: soft, non-tender, non-distended         Assessment & Plan:   See Problem List for Assessment and Plan of chronic medical problems.  Follow-up as needed

## 2015-09-27 NOTE — Assessment & Plan Note (Signed)
Following with Dr Amil Amen No episodes in over one year

## 2015-09-27 NOTE — Assessment & Plan Note (Signed)
Has never been on medication We'll have her blood work checked by the rheumatologist since he does have blood work recently She would like to avoid medication if possible

## 2015-09-27 NOTE — Assessment & Plan Note (Signed)
Following with rheumatology Currently in remission

## 2016-04-16 LAB — BASIC METABOLIC PANEL
BUN: 11 mg/dL (ref 4–21)
CREATININE: 0.7 mg/dL (ref 0.5–1.1)
GLUCOSE: 100 mg/dL
POTASSIUM: 4.7 mmol/L (ref 3.4–5.3)
Sodium: 143 mmol/L (ref 137–147)

## 2016-04-16 LAB — CBC AND DIFFERENTIAL
HCT: 39 % (ref 36–46)
Hemoglobin: 12.3 g/dL (ref 12.0–16.0)
Neutrophils Absolute: 79 /uL
Platelets: 315 10*3/uL (ref 150–399)
WBC: 5.6 10*3/mL

## 2016-04-16 LAB — LIPID PANEL
Cholesterol: 166 mg/dL (ref 0–200)
HDL: 43 mg/dL (ref 35–70)
LDL Cholesterol: 102 mg/dL
Triglycerides: 104 mg/dL (ref 40–160)

## 2016-04-16 LAB — HEPATIC FUNCTION PANEL
ALT: 8 U/L (ref 7–35)
AST: 14 U/L (ref 13–35)
Alkaline Phosphatase: 100 U/L (ref 25–125)
Bilirubin, Total: 0.6 mg/dL

## 2016-05-01 ENCOUNTER — Encounter: Payer: Self-pay | Admitting: Internal Medicine

## 2016-07-11 ENCOUNTER — Encounter: Payer: PRIVATE HEALTH INSURANCE | Admitting: Internal Medicine

## 2017-10-14 ENCOUNTER — Telehealth: Payer: Self-pay | Admitting: Internal Medicine

## 2017-10-14 NOTE — Telephone Encounter (Signed)
Hi Dr. Henrene Pastor, this pt is requesting to be your patient. She is a former patient of Dr. Olevia Perches. She stated that some of her relatives see you for GI care and that she has met you before and you might remember her as Molly Erickson. She needs an EGD to stretch her throat. Please advise if it is ok to schedule an appt with you. Thank you

## 2017-10-14 NOTE — Telephone Encounter (Signed)
Yes, I know Gay Filler very well. Get her an office appointment to see me. Thanks

## 2017-10-15 NOTE — Telephone Encounter (Signed)
Leave message for patient to call back to schedule appt.

## 2017-10-20 ENCOUNTER — Encounter: Payer: Self-pay | Admitting: Internal Medicine

## 2017-10-20 NOTE — Telephone Encounter (Signed)
Pt scheduled on 12/14/17 at 9:30am.

## 2017-12-14 ENCOUNTER — Ambulatory Visit (INDEPENDENT_AMBULATORY_CARE_PROVIDER_SITE_OTHER): Payer: 59 | Admitting: Internal Medicine

## 2017-12-14 ENCOUNTER — Encounter: Payer: Self-pay | Admitting: Internal Medicine

## 2017-12-14 VITALS — BP 100/68 | HR 72 | Ht 67.0 in | Wt 169.2 lb

## 2017-12-14 DIAGNOSIS — R143 Flatulence: Secondary | ICD-10-CM

## 2017-12-14 DIAGNOSIS — K6289 Other specified diseases of anus and rectum: Secondary | ICD-10-CM

## 2017-12-14 DIAGNOSIS — K222 Esophageal obstruction: Secondary | ICD-10-CM

## 2017-12-14 DIAGNOSIS — R1310 Dysphagia, unspecified: Secondary | ICD-10-CM | POA: Diagnosis not present

## 2017-12-14 DIAGNOSIS — K5901 Slow transit constipation: Secondary | ICD-10-CM

## 2017-12-14 DIAGNOSIS — K219 Gastro-esophageal reflux disease without esophagitis: Secondary | ICD-10-CM

## 2017-12-14 MED ORDER — OMEPRAZOLE 20 MG PO CPDR: 20.0000 mg | DELAYED_RELEASE_CAPSULE | Freq: Every day | ORAL | 6 refills | Status: DC

## 2017-12-14 NOTE — Patient Instructions (Signed)
You have been scheduled for an endoscopy. Please follow written instructions given to you at your visit today. If you use inhalers (even only as needed), please bring them with you on the day of your procedure. Your physician has requested that you go to www.startemmi.com and enter the access code given to you at your visit today. This web site gives a general overview about your procedure. However, you should still follow specific instructions given to you by our office regarding your preparation for the procedure.   Take a probiotic once daily for gas  Take Miralax daily as needed for constipation

## 2017-12-14 NOTE — Progress Notes (Signed)
HISTORY OF PRESENT ILLNESS:  Molly Erickson is a 67 y.o. 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 Previous patient of Dr. Olevia Erickson. Establishing care with me today. He has not been seen in this office since 2014. Presents today with the following chief complaints: 1. DYSPHAGIA. The patient reports intermittent solid food dysphagia worsening over the past 9 months. Currently with 1-2 episodes per week despite eating carefully. She did undergo upper endoscopy in 2014. Found to have esophageal stricture and hiatal hernia. Was dilated to 16 mm with Savary dilator. This helped. 2. GERD. Patient was having significant problems with reflux symptoms but was successful in significant weight reduction at which point symptoms became more intermittent. She has been treating her heartburn with Pepcid on demand. 3. Painless rectal bump without bleeding which she has noticed a few years ago and more recently a few months ago. She worries about cancer. The lesion is internal and rubbery 4. Problems with increased intestinal gas and flatus after having had a viral gastroenteritis in February 2019 5. Chronic constipation with bowel movements every 1-3 days. May have some abdominal discomfort associated with this. Does not use laxatives or fiber. Previous colonoscopies performed 2004 and 2014 without neoplasia  Review of outside laboratories from November 2018 find normal CBC and comp [name] metabolic panel. Previous endoscopy reports from Dr. Olevia Erickson 2014 reviewed and summarized.   REVIEW OF SYSTEMS:  All non-GI ROS negative unless otherwise stated in the history of present illness except for minor hematuria previously evaluated per patient, arthritis  Past Medical History:  Diagnosis Date  . Abscess of finger 11/15/2010  . Bronchitis   . Cancer ([place]) 06/2011   Basal cell, Dr Molly Erickson , [place]  . CIN  I (cervical intraepithelial neoplasia I)   . Fever blister   . GERD (gastroesophageal reflux disease)   . Hematuria   . Hyperlipidemia   . Hypothyroidism   . Neck pain 12/31/2014  . Osteopenia   . Relapsing polychondritis   . Wegener's syndrome [facility])     Past Surgical History:  Procedure Laterality Date  . BREAST BIOPSY     Papilloma-Intraductal  . BREAST LUMPECTOMY     benign  . COLPOSCOPY    . ESOPHAGEAL DILATION     x 1  . LUNG BIOPSY      Social History Molly Erickson  reports that she has never smoked. She has never used smokeless tobacco. She reports that she drinks about 2.4 oz of alcohol per week. She reports that she does not use drugs.  family history includes Breast cancer in her maternal aunt and mother; Coronary artery disease in her father; Diabetes in her daughter; Heart disease in her father; Hypertension in her father; Leukemia in her mother; Prostate cancer in her father; Skin cancer in her father.  No Known Allergies     PHYSICAL EXAMINATION: Vital signs: BP 100/68   Pulse 72   Ht 5\' 7"  (1.702 m)   Wt 169 lb 4 oz (76.8 kg)   BMI 26.51 kg/m   Constitutional: generally well-appearing, no acute distress Psychiatric: alert and oriented x3, cooperative Eyes: extraocular movements intact, anicteric, conjunctiva pink Mouth: oral pharynx moist, no lesions Neck: supple no lymphadenopathy Cardiovascular: heart regular rate and rhythm, no murmur Lungs: clear to auscultation bilaterally Abdomen: soft, nontender, nondistended, no obvious ascites, no peritoneal signs, normal bowel sounds, no organomegaly Rectal: no external abnormalities. No internal mass or  tenderness. Small palpable lesion at 10:00 (see anoscopy below). Hemoccult negative stool. Chaperoned Extremities: no clubbing, cyanosis, or lower extremity edema bilaterally Skin: no lesions on visible extremities Neuro: No focal deficits. Cranial nerves intact  ANOSCOPY: No external abnormalities.  No internal hemorrhoids. Small hypertrophic anal papilla at 10:00.   ASSESSMENT:  #1. Chronic GERD. Ongoing. Currently managed with on demand H2 receptor antagonist with occasional breakthrough #2. Intermittent solid food dysphagia likely secondary to recurrent peptic stricture #3. Increased intestinal gas post infectious gastroenteritis. No alarm features #4. Benign hypertrophic anal papilla #5. Previous colonoscopy 2004 and 2014 negative for neoplasia  PLAN:  #1. Reflux precautions. Reviewed and provided #2. Prescribe omeprazole 20 mg daily #3. Schedule upper endoscopy with esophageal dilation. #4. Recommend probiotic align for increased intestinal gas. One daily for 4 weeks #5. Recommend MiraLAX for constipation. One dose daily. Titrate to achieve desired effect. Reviewed #6. Reassurance regarding hypertrophic anal papilla. Advised to report any significant changes such as growth, pain, or bleeding. She agreed #7. Routine screening colonoscopy 2024

## 2017-12-15 ENCOUNTER — Encounter: Payer: 59 | Admitting: Internal Medicine

## 2018-01-12 ENCOUNTER — Encounter: Payer: Self-pay | Admitting: Internal Medicine

## 2018-01-12 ENCOUNTER — Ambulatory Visit (INDEPENDENT_AMBULATORY_CARE_PROVIDER_SITE_OTHER): Payer: 59 | Admitting: Internal Medicine

## 2018-01-12 DIAGNOSIS — M79644 Pain in right finger(s): Secondary | ICD-10-CM | POA: Insufficient documentation

## 2018-01-12 MED ORDER — CEPHALEXIN 500 MG PO CAPS
500.0000 mg | ORAL_CAPSULE | Freq: Two times a day (BID) | ORAL | 0 refills | Status: DC
Start: 2018-01-12 — End: 2018-03-16

## 2018-01-12 NOTE — Patient Instructions (Signed)
We have sent in keflex to take 1 pill twice a day for 5 days. 

## 2018-01-12 NOTE — Assessment & Plan Note (Signed)
Rx for keflex for possible skin infection. It is unclear the underlying cause although it is possible that this is autoimmune related. There appears to have been no trauma or underlying injury.

## 2018-01-12 NOTE — Progress Notes (Signed)
   Subjective:    Patient ID: Molly Erickson, female    DOB: 07/09/1950, 67 y.o.   MRN: [id number]  HPI The patient is a 67 YO female coming in for redness and swelling of the right thumb finger pad. She has had this before several months ago and it resolved on its own. She has had similar things to happen on the small finger nail bed both hands at various times. 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 She has never been able to figure this out although she has seen dermatology as well. Sometimes she has gotten an antibiotic if it is recurrent in the same place or a lot of swelling or redness. Denies fevers or chills. The swelling has caused the nail to lift from the bed toward the middle of the nail. She had manicure about 2 weeks ago but never lets them use any tools on the nails or remove cuticles etc.   Review of Systems  Constitutional: Negative.   HENT: Negative.   Eyes: Negative.   Respiratory: Negative for cough, chest tightness and shortness of breath.   Cardiovascular: Negative for chest pain, palpitations and leg swelling.  Gastrointestinal: Negative for abdominal distention, abdominal pain, constipation, diarrhea, nausea and vomiting.  Musculoskeletal: Positive for joint swelling.  Skin: Positive for color change.  Neurological: Negative.   Psychiatric/Behavioral: Negative.       Objective:   Physical Exam  Constitutional: She is oriented to person, place, and time. She appears well-developed and well-nourished.  HENT:  Head: Normocephalic and atraumatic.  Eyes: EOM are normal.  Neck: Normal range of motion.  Cardiovascular: Normal rate and regular rhythm.  Pulmonary/Chest: Effort normal and breath sounds normal. No respiratory distress. She has no wheezes. She has no rales.  Abdominal: Soft. Bowel sounds are normal. She exhibits no distension. There is no tenderness. There is no rebound.    Musculoskeletal: She exhibits no edema.  Neurological: She is alert and oriented to person, place, and time. Coordination normal.  Skin: Skin is warm and dry. Rash noted.  Redness and minimal swelling at the distal right thumb, half of the nail is not attached to the skin underneath, no abscess formation noted or blistering.   Psychiatric: She has a normal mood and affect.   Vitals:   01/12/18 1557  BP: 110/70  Pulse: 64  Temp: 97.8 F (36.6 C)  TempSrc: Oral  SpO2: 98%  Weight: 175 lb (79.4 kg)  Height: 5\' 7"  (1.702 m)      Assessment & Plan:

## 2018-01-19 ENCOUNTER — Encounter: Payer: Self-pay | Admitting: Internal Medicine

## 2018-01-19 ENCOUNTER — Ambulatory Visit (AMBULATORY_SURGERY_CENTER): Payer: 59 | Admitting: Internal Medicine

## 2018-01-19 VITALS — BP 139/76 | HR 60 | Temp 98.7°F | Resp 16 | Ht 67.0 in | Wt 169.0 lb

## 2018-01-19 DIAGNOSIS — K222 Esophageal obstruction: Secondary | ICD-10-CM

## 2018-01-19 DIAGNOSIS — K219 Gastro-esophageal reflux disease without esophagitis: Secondary | ICD-10-CM

## 2018-01-19 DIAGNOSIS — R1310 Dysphagia, unspecified: Secondary | ICD-10-CM | POA: Diagnosis not present

## 2018-01-19 DIAGNOSIS — K2210 Ulcer of esophagus without bleeding: Secondary | ICD-10-CM

## 2018-01-19 MED ORDER — OMEPRAZOLE 40 MG PO CPDR: 40.0000 mg | DELAYED_RELEASE_CAPSULE | Freq: Every day | ORAL | 11 refills | Status: DC

## 2018-01-19 MED ORDER — SODIUM CHLORIDE 0.9 % IV SOLN: 500.0000 mL | Freq: Once | INTRAVENOUS | Status: DC

## 2018-01-19 NOTE — Patient Instructions (Addendum)
Please begin Omeprazole 40 mg by mouth daily. Begin post dilation diet. Repeat EGD with dilation at [facility] in about 8-10 weeks. Please read handout on esophagitis.   YOU HAD AN ENDOSCOPIC PROCEDURE TODAY AT [facility]:   Refer to the procedure report that was given to you for any specific questions about what was found during the examination.  If the procedure report does not answer your questions, please call your gastroenterologist to clarify.  If you requested that your care partner not be given the details of your procedure findings, then the procedure report has been included in a sealed envelope for you to review at your convenience later.  YOU SHOULD EXPECT: Some feelings of bloating in the abdomen. Passage of more gas than usual.  Walking can help get rid of the air that was put into your GI tract during the procedure and reduce the bloating. If you had a lower endoscopy (such as a colonoscopy or flexible sigmoidoscopy) you may notice spotting of blood in your stool or on the toilet paper. If you underwent a bowel prep for your procedure, you may not have a normal bowel movement for a few days.  Please Note:  You might notice some irritation and congestion in your nose or some drainage.  This is from the oxygen used during your procedure.  There is no need for concern and it should clear up in a day or so.  SYMPTOMS TO REPORT IMMEDIATELY:    Following upper endoscopy (EGD)  Vomiting of blood or coffee ground material  New chest pain or pain under the shoulder blades  Painful or persistently difficult swallowing  New shortness of breath  Fever of 100F or higher  Black, tarry-looking stools  For urgent or emergent issues, a gastroenterologist can be reached at any hour by calling [phone].   DIET:   Drink plenty of fluids but you should avoid alcoholic beverages for 24 hours.  ACTIVITY:  You should plan to take it easy for the rest of today and you should NOT  DRIVE or use heavy machinery until tomorrow (because of the sedation medicines used during the test).    FOLLOW UP: Our staff will call the number listed on your records the next business day following your procedure to check on you and address any questions or concerns that you may have regarding the information given to you following your procedure. If we do not reach you, we will leave a message.  However, if you are feeling well and you are not experiencing any problems, there is no need to return our call.  We will assume that you have returned to your regular daily activities without incident.  If any biopsies were taken you will be contacted by phone or by letter within the next 1-3 weeks.  Please call us at [phone] if you have not heard about the biopsies in 3 weeks.    SIGNATURES/CONFIDENTIALITY: You and/or your care partner have signed paperwork which will be entered into your electronic medical record.  These signatures attest to the fact that that the information above on your After Visit Summary has been reviewed and is understood.  Full responsibility of the confidentiality of this discharge information lies with you and/or your care-partner.

## 2018-01-19 NOTE — Op Note (Signed)
[facility] Patient Name: Molly Erickson Procedure Date: 01/19/2018 3:40 PM MRN: [id number] Endoscopist: Docia Chuck. Henrene Pastor , MD Age: 67 Referring MD:  Date of Birth: 15-Oct-1950 Gender: Female Account #: [id number] Procedure:                Upper GI endoscopy, with biopsies, with balloon                            dilation of the esophagus -73mm Indications:              Dysphagia, Esophageal reflux Medicines:                Monitored Anesthesia Care Procedure:                Pre-Anesthesia Assessment:                           - Prior to the procedure, a History and Physical                            was performed, and patient medications and                            allergies were reviewed. The patient's tolerance of                            previous anesthesia was also reviewed. The risks                            and benefits of the procedure and the sedation                            options and risks were discussed with the patient.                            All questions were answered, and informed consent                            was obtained. Prior Anticoagulants: The patient has                            taken no previous anticoagulant or antiplatelet                            agents. ASA Grade Assessment: II - A patient with                            mild systemic disease. After reviewing the risks                            and benefits, the patient was deemed in                            satisfactory condition to undergo the procedure.  After obtaining informed consent, the endoscope was                            passed under direct vision. Throughout the                            procedure, the patient's blood pressure, pulse, and                            oxygen saturations were monitored continuously. The                            Endoscope was introduced through the mouth, and                            advanced to the  second part of duodenum. The upper                            GI endoscopy was accomplished without difficulty.                            The patient tolerated the procedure well. Scope In: Scope Out: Findings:                 One benign-appearing, intrinsic moderate stenosis                            was found 35 cm from the incisors. This stenosis                            measured 1.4 cm (inner diameter). The mucosa was                            ulcerated and slightly irregular. This was biopsied                            with a cold forceps for histology. A TTS dilator                            was passed through the scope. Dilation with an                            18-19-20 mm balloon dilator was performed to 20 mm.                           The exam of the esophagus revealed scarring                            proximal to the distal stricture.                           The stomach was normal is a 4-5 cm hiatal hernia.  The examined duodenum was normal.                           The cardia and gastric fundus were normal on                            retroflexion. Complications:            No immediate complications. Estimated Blood Loss:     Estimated blood loss: none. Impression:               - Benign-appearing esophageal stenosis. Active                            esophagitis. Biopsied. Dilated.                           - Normal stomach with 4-5 cm hiatal hernia.                           - Normal examined duodenum. Recommendation:           1. Patient has a contact number available for                            emergencies. The signs and symptoms of potential                            delayed complications were discussed with the                            patient. Return to normal activities tomorrow.                            Written discharge instructions were provided to the                            patient.                           2.  Post dilation diet.                           3. Prescribe omeprazole 40 mg daily; #30; 11 refills                           4. Repeat EGD with dilation in the [place] in about                            8-10 weeks. Docia Chuck. Henrene Pastor, MD 01/19/2018 4:03:47 PM This report has been signed electronically.

## 2018-01-19 NOTE — Progress Notes (Signed)
Pt's states no medical or surgical changes since previsit or office visit. 

## 2018-01-19 NOTE — Progress Notes (Signed)
Report to PACU, RN, vss, BBS= Clear.  

## 2018-01-19 NOTE — Progress Notes (Signed)
Contacted Dr. Blanch Media nurse, Rosanne Sack, to contact patient to schedule a repeat EGD with Dilation in about 8-10 weeks at [facility]. Unable to open up Dr. Blanch Media schedule at present.

## 2018-01-19 NOTE — Progress Notes (Signed)
Called to room to assist during endoscopic procedure.  Patient ID and intended procedure confirmed with present staff. Received instructions for my participation in the procedure from the performing physician.  

## 2018-01-20 ENCOUNTER — Telehealth: Payer: Self-pay | Admitting: *Deleted

## 2018-01-20 NOTE — Telephone Encounter (Signed)
  Follow up Call-  Call back number 01/19/2018  Post procedure Call Back phone  # [phone]  Permission to leave phone message Yes  Some recent data might be hidden     Patient questions:  Do you have a fever, pain , or abdominal swelling? No. Pain Score  0 *  Have you tolerated food without any problems? Yes.    Have you been able to return to your normal activities? Yes.    Do you have any questions about your discharge instructions: Diet   No. Medications  No. Follow up visit  No.  Do you have questions or concerns about your Care? No.  Actions: * If pain score is 4 or above: No action needed, pain <4.

## 2018-01-26 ENCOUNTER — Telehealth: Payer: Self-pay

## 2018-01-26 NOTE — Telephone Encounter (Signed)
Left message for pt to call back. Pt needs to be scheduled for an EGD with dil at the end of Sept first part of Oct.

## 2018-01-27 ENCOUNTER — Encounter: Payer: Self-pay | Admitting: Internal Medicine

## 2018-01-27 ENCOUNTER — Other Ambulatory Visit: Payer: Self-pay

## 2018-01-27 DIAGNOSIS — R1310 Dysphagia, unspecified: Secondary | ICD-10-CM

## 2018-01-27 NOTE — Telephone Encounter (Signed)
Patient returned phone call. Best # [phone]

## 2018-01-27 NOTE — Telephone Encounter (Signed)
Pt scheduled for previsit 03/16/18@10am , EGD with dil scheduled in the [facility] 03/18/18@10am . Pt aware of appts.

## 2018-03-16 ENCOUNTER — Ambulatory Visit (AMBULATORY_SURGERY_CENTER): Payer: Self-pay

## 2018-03-16 ENCOUNTER — Encounter: Payer: Self-pay | Admitting: Internal Medicine

## 2018-03-16 VITALS — Ht 67.0 in | Wt 173.8 lb

## 2018-03-16 DIAGNOSIS — R1310 Dysphagia, unspecified: Secondary | ICD-10-CM

## 2018-03-16 NOTE — Progress Notes (Signed)
Denies allergies to eggs or soy products. Denies complication of anesthesia or sedation. Denies use of weight loss medication. Denies use of O2.   Emmi instructions declined.  

## 2018-03-18 ENCOUNTER — Encounter: Payer: 59 | Admitting: Internal Medicine

## 2018-03-18 ENCOUNTER — Ambulatory Visit (AMBULATORY_SURGERY_CENTER): Payer: 59 | Admitting: Internal Medicine

## 2018-03-18 ENCOUNTER — Encounter: Payer: Self-pay | Admitting: Internal Medicine

## 2018-03-18 VITALS — BP 124/70 | HR 59 | Temp 98.4°F | Resp 16 | Ht 67.0 in | Wt 173.0 lb

## 2018-03-18 DIAGNOSIS — K219 Gastro-esophageal reflux disease without esophagitis: Secondary | ICD-10-CM

## 2018-03-18 DIAGNOSIS — R1310 Dysphagia, unspecified: Secondary | ICD-10-CM

## 2018-03-18 DIAGNOSIS — K222 Esophageal obstruction: Secondary | ICD-10-CM

## 2018-03-18 MED ORDER — OMEPRAZOLE 40 MG PO CPDR: 40.0000 mg | DELAYED_RELEASE_CAPSULE | Freq: Two times a day (BID) | ORAL | 11 refills | Status: DC

## 2018-03-18 MED ORDER — SODIUM CHLORIDE 0.9 % IV SOLN: 500.0000 mL | Freq: Once | INTRAVENOUS | Status: DC

## 2018-03-18 NOTE — Patient Instructions (Signed)
  Reflux precautions. Follow Dilation Diet. Increase Omeprazole to 40 mg Twice daily. Return to see Dr. Henrene Pastor in GI clinic in 3 months.    YOU HAD AN ENDOSCOPIC PROCEDURE TODAY AT [facility]:   Refer to the procedure report that was given to you for any specific questions about what was found during the examination.  If the procedure report does not answer your questions, please call your gastroenterologist to clarify.  If you requested that your care partner not be given the details of your procedure findings, then the procedure report has been included in a sealed envelope for you to review at your convenience later.  YOU SHOULD EXPECT: Some feelings of bloating in the abdomen. Passage of more gas than usual.  Walking can help get rid of the air that was put into your GI tract during the procedure and reduce the bloating. If you had a lower endoscopy (such as a colonoscopy or flexible sigmoidoscopy) you may notice spotting of blood in your stool or on the toilet paper. If you underwent a bowel prep for your procedure, you may not have a normal bowel movement for a few days.  Please Note:  You might notice some irritation and congestion in your nose or some drainage.  This is from the oxygen used during your procedure.  There is no need for concern and it should clear up in a day or so.  SYMPTOMS TO REPORT IMMEDIATELY:    Following upper endoscopy (EGD)  Vomiting of blood or coffee ground material  New chest pain or pain under the shoulder blades  Painful or persistently difficult swallowing  New shortness of breath  Fever of 100F or higher  Black, tarry-looking stools  For urgent or emergent issues, a gastroenterologist can be reached at any hour by calling [phone].   DIET:   Drink plenty of fluids but you should avoid alcoholic beverages for 24 hours.  ACTIVITY:  You should plan to take it easy for the rest of today and you should NOT DRIVE or use heavy  machinery until tomorrow (because of the sedation medicines used during the test).    FOLLOW UP: Our staff will call the number listed on your records the next business day following your procedure to check on you and address any questions or concerns that you may have regarding the information given to you following your procedure. If we do not reach you, we will leave a message.  However, if you are feeling well and you are not experiencing any problems, there is no need to return our call.  We will assume that you have returned to your regular daily activities without incident.  If any biopsies were taken you will be contacted by phone or by letter within the next 1-3 weeks.  Please call us at [phone] if you have not heard about the biopsies in 3 weeks.    SIGNATURES/CONFIDENTIALITY: You and/or your care partner have signed paperwork which will be entered into your electronic medical record.  These signatures attest to the fact that that the information above on your After Visit Summary has been reviewed and is understood.  Full responsibility of the confidentiality of this discharge information lies with you and/or your care-partner.

## 2018-03-18 NOTE — Progress Notes (Signed)
Pt's states no medical or surgical changes since previsit or office visit. 

## 2018-03-18 NOTE — Op Note (Signed)
[facility] Patient Name: Molly Erickson Procedure Date: 03/18/2018 10:25 AM MRN: [id number] Endoscopist: Docia Chuck. Henrene Pastor , MD Age: 67 Referring MD:  Date of Birth: 06/09/1951 Gender: Female Account #: [id number] Procedure:                Upper GI endoscopy with balloon dilation of the                            esophagus?"20 mm max Indications:              Dysphagia, Follow-up of reflux esophagitis Medicines:                Monitored Anesthesia Care Procedure:                Pre-Anesthesia Assessment:                           - Prior to the procedure, a History and Physical                            was performed, and patient medications and                            allergies were reviewed. The patient's tolerance of                            previous anesthesia was also reviewed. The risks                            and benefits of the procedure and the sedation                            options and risks were discussed with the patient.                            All questions were answered, and informed consent                            was obtained. Prior Anticoagulants: The patient has                            taken no previous anticoagulant or antiplatelet                            agents. ASA Grade Assessment: II - A patient with                            mild systemic disease. After reviewing the risks                            and benefits, the patient was deemed in                            satisfactory condition to undergo the procedure.  After obtaining informed consent, the endoscope was                            passed under direct vision. Throughout the                            procedure, the patient's blood pressure, pulse, and                            oxygen saturations were monitored continuously. The                            Endoscope was introduced through the mouth, and                            advanced to  the second part of duodenum. The upper                            GI endoscopy was accomplished without difficulty.                            The patient tolerated the procedure well. Scope In: Scope Out: Findings:                 The esophageal mucosa has healed from previous                            examination. One benign-appearing, intrinsic                            moderate stenosis was found. This stenosis measured                            1.5 cm (inner diameter). A TTS dilator was passed                            through the scope. Dilation with an 18-19-20 mm                            balloon dilator was performed to 20 mm. Minimal                            heme.                           The stomach was normal except for a 4 to 5 cm                            hiatal hernia.                           The examined duodenum was normal.                           The cardia and gastric fundus were  normal on                            retroflexion. Complications:            No immediate complications. Estimated Blood Loss:     Estimated blood loss: none. Impression:               - Benign-appearing esophageal stenosis. Dilated.                           - Normal stomach save moderate sized hiatal hernia.                           - Normal examined duodenum.                           - No specimens collected. Recommendation:           1. Reflux precautions                           2. Post dilation diet                           3. Increase omeprazole to 40 mg TWICE daily; #60;                            11 refills                           4. Please make an office appointment with Dr. Henrene Pastor                            in 3 months. Docia Chuck. Henrene Pastor, MD 03/18/2018 10:45:54 AM This report has been signed electronically.

## 2018-03-18 NOTE — Progress Notes (Signed)
To recovery, report to RN, VSS. 

## 2018-03-18 NOTE — Progress Notes (Signed)
Called to room to assist during endoscopic procedure.  Patient ID and intended procedure confirmed with present staff. Received instructions for my participation in the procedure from the performing physician.  

## 2018-03-19 ENCOUNTER — Telehealth: Payer: Self-pay

## 2018-03-19 NOTE — Telephone Encounter (Signed)
  Follow up Call-  Call Cariana Karge number 03/18/2018 01/19/2018  Post procedure Call Minard Millirons phone  # [phone]  Permission to leave phone message Yes Yes  Some recent data might be hidden     Patient questions:  Do you have a fever, pain , or abdominal swelling? No. Pain Score  0 *  Have you tolerated food without any problems? Yes.    Have you been able to return to your normal activities? Yes.    Do you have any questions about your discharge instructions: Diet   No. Medications  No. Follow up visit  No.  Do you have questions or concerns about your Care? No.  Actions: * If pain score is 4 or above: No action needed, pain <4.

## 2018-07-27 ENCOUNTER — Encounter: Payer: Self-pay | Admitting: Internal Medicine

## 2018-10-13 NOTE — Progress Notes (Signed)
Virtual Visit via Video Note  I connected with Molly Erickson on 10/13/18 at 10:15 AM EDT by a video enabled telemedicine application and verified that I am speaking with the correct person using two identifiers.   I discussed the limitations of evaluation and management by telemedicine and the availability of in person appointments. The patient expressed understanding and agreed to proceed.  The patient is currently at home and I am in the office.    No referring provider.    History of Present Illness: This is an acute visit for concerns regarding her immune system.  I personally last saw the patient 3 years ago.  She was last seen in the office last summer for thumb pain.  She follows with Dr. Amil Amen regularly for her history of Wegener's granulomatosis and relapsing polychondritis.  She has not been on any medication for this and has been stable.  Her biggest concern today is regarding the coronavirus.  She is concerned that she may be at increased risk because of her history of an autoimmune disease.  She wondered about getting tested if she did have symptoms.  She wondered about what treatments were currently available here and other local hospitals.  She wondered where she should go if she needed to go someplace.  She currently has no symptoms.  She feels well.  She has been remaining at home and taking all precautions.     Social History   Socioeconomic History  . Marital status: Married    Spouse name: Not on file  . Number of children: Not on file  . Years of education: Not on file  . Highest education level: Not on file  Occupational History  . Not on file  Social Needs  . Financial resource strain: Not on file  . Food insecurity:    Worry: Not on file    Inability: Not on file  . Transportation needs:    Medical: Not on file    Non-medical: Not on file  Tobacco Use  . Smoking status: Never Smoker  . Smokeless tobacco: Never Used  Substance and Sexual  Activity  . Alcohol use: Yes    Alcohol/week: 4.0 standard drinks    Types: 4 Standard drinks or equivalent per week    Comment: socially  . Drug use: No  . Sexual activity: Yes    Birth control/protection: Post-menopausal  Lifestyle  . Physical activity:    Days per week: Not on file    Minutes per session: Not on file  . Stress: Not on file  Relationships  . Social connections:    Talks on phone: Not on file    Gets together: Not on file    Attends religious service: Not on file    Active member of club or organization: Not on file    Attends meetings of clubs or organizations: Not on file    Relationship status: Not on file  Other Topics Concern  . Not on file  Social History Narrative  . Not on file     Observations/Objective: Appears well in NAD   Assessment and Plan:  See Problem List for Assessment and Plan of chronic medical problems.   Follow Up Instructions:    I discussed the assessment and treatment plan with the patient. The patient was provided an opportunity to ask questions and all were answered. The patient agreed with the plan and demonstrated an understanding of the instructions.   The patient was advised to call back or  seek an in-person evaluation if the symptoms worsen or if the condition fails to improve as anticipated.   15 minutes were spent face-to-face with the patient, over 50% of which was spent counseling regarding coronavirus     Binnie Rail, MD

## 2018-10-14 ENCOUNTER — Encounter: Payer: Self-pay | Admitting: Internal Medicine

## 2018-10-14 ENCOUNTER — Ambulatory Visit (INDEPENDENT_AMBULATORY_CARE_PROVIDER_SITE_OTHER): Payer: 59 | Admitting: Internal Medicine

## 2018-10-14 DIAGNOSIS — D8989 Other specified disorders involving the immune mechanism, not elsewhere classified: Secondary | ICD-10-CM

## 2018-10-14 NOTE — Assessment & Plan Note (Signed)
Because of her immune disorder she is very concerned about the coronavirus.  She has several questions regarding getting tested if she did develop symptoms, where she should go if she does have it, treatment failure currently being tried Discussed with her all of the above.  Answered all of her questions the best my ability  Advised her to call back with any questions or concerns

## 2019-02-25 ENCOUNTER — Telehealth: Payer: Self-pay | Admitting: Internal Medicine

## 2019-02-25 DIAGNOSIS — K219 Gastro-esophageal reflux disease without esophagitis: Secondary | ICD-10-CM

## 2019-02-25 DIAGNOSIS — K222 Esophageal obstruction: Secondary | ICD-10-CM

## 2019-02-25 NOTE — Telephone Encounter (Signed)
Molly Erickson, I was contacted by the patient's husband stating that she was having recurrent problems with dysphasia and requesting repeat esophageal dilation.  As such: 1.  Please make sure that she is taking omeprazole 40 mg TWICE daily-every day. 2.  Schedule EGD with balloon dilation of the esophagus in the [facility] (diagnoses are GERD and esophageal stricture). Thanks. Dr. Henrene Pastor

## 2019-02-25 NOTE — Telephone Encounter (Signed)
Patient notified of the recommendations.  She has been taking the omeprazole daily, she will resume BID She will come for pre-visit on 03/07/19 10:30 and EGD on 03/08/19 3:00

## 2019-02-25 NOTE — Addendum Note (Signed)
Addended by: Marlon Pel on: 02/25/2019 10:52 AM   Modules accepted: Orders

## 2019-03-01 ENCOUNTER — Encounter: Payer: Self-pay | Admitting: Internal Medicine

## 2019-03-07 ENCOUNTER — Telehealth: Payer: Self-pay | Admitting: Internal Medicine

## 2019-03-07 ENCOUNTER — Ambulatory Visit (AMBULATORY_SURGERY_CENTER): Payer: Self-pay | Admitting: *Deleted

## 2019-03-07 ENCOUNTER — Other Ambulatory Visit: Payer: Self-pay

## 2019-03-07 VITALS — Temp 97.2°F | Ht 67.0 in | Wt 178.8 lb

## 2019-03-07 DIAGNOSIS — K219 Gastro-esophageal reflux disease without esophagitis: Secondary | ICD-10-CM

## 2019-03-07 DIAGNOSIS — K222 Esophageal obstruction: Secondary | ICD-10-CM

## 2019-03-07 NOTE — Telephone Encounter (Signed)
Do you now or have you had a fever in the last 14 days?         Do you have any respiratory symptoms of shortness of breath or cough now or in the last 14 days?        Do you have any family members or close contacts with diagnosed or suspected Covid-19 in the past 14 days?         Have you been tested for Covid-19 and found to be positive?        Pt made aware to check in on theth floor and that care partner may wait in the car or come up to the lobby during the procedure but will need to provide their own mask.

## 2019-03-07 NOTE — Progress Notes (Signed)
No egg or soy allergy known to patient  No issues with past sedation with any surgeries  or procedures, no intubation problems  No diet pills per patient No home 02 use per patient  No blood thinners per patient  Pt denies issues with constipation  No A fib or A flutter  EMMI video sent to pt's e mail  

## 2019-03-08 ENCOUNTER — Ambulatory Visit (AMBULATORY_SURGERY_CENTER): Payer: 59 | Admitting: Internal Medicine

## 2019-03-08 ENCOUNTER — Encounter: Payer: Self-pay | Admitting: Internal Medicine

## 2019-03-08 VITALS — BP 147/72 | HR 67 | Temp 97.6°F | Resp 9 | Ht 67.0 in | Wt 178.8 lb

## 2019-03-08 DIAGNOSIS — K222 Esophageal obstruction: Secondary | ICD-10-CM

## 2019-03-08 DIAGNOSIS — K219 Gastro-esophageal reflux disease without esophagitis: Secondary | ICD-10-CM

## 2019-03-08 DIAGNOSIS — R1310 Dysphagia, unspecified: Secondary | ICD-10-CM | POA: Diagnosis not present

## 2019-03-08 MED ORDER — SODIUM CHLORIDE 0.9 % IV SOLN: 500.0000 mL | Freq: Once | INTRAVENOUS | Status: DC

## 2019-03-08 MED ORDER — OMEPRAZOLE 40 MG PO CPDR: 40.0000 mg | DELAYED_RELEASE_CAPSULE | Freq: Two times a day (BID) | ORAL | 11 refills | Status: DC

## 2019-03-08 NOTE — Progress Notes (Signed)
A and O x3. Report to RN. Tolerated MAC anesthesia well.Teeth unchanged after procedure.

## 2019-03-08 NOTE — Telephone Encounter (Signed)
Pt called back and answered "NO" to all screening questions.

## 2019-03-08 NOTE — Patient Instructions (Signed)
Please see handouts given to you on Stricture and Esophagitis. Dr. Henrene Pastor has started you a new medication Omeprazole 40 mg twice a day. Follow Post dilation diet.    YOU HAD AN ENDOSCOPIC PROCEDURE TODAY AT [facility]:   Refer to the procedure report that was given to you for any specific questions about what was found during the examination.  If the procedure report does not answer your questions, please call your gastroenterologist to clarify.  If you requested that your care partner not be given the details of your procedure findings, then the procedure report has been included in a sealed envelope for you to review at your convenience later.  YOU SHOULD EXPECT: Some feelings of bloating in the abdomen. Passage of more gas than usual.  Walking can help get rid of the air that was put into your GI tract during the procedure and reduce the bloating. If you had a lower endoscopy (such as a colonoscopy or flexible sigmoidoscopy) you may notice spotting of blood in your stool or on the toilet paper. If you underwent a bowel prep for your procedure, you may not have a normal bowel movement for a few days.  Please Note:  You might notice some irritation and congestion in your nose or some drainage.  This is from the oxygen used during your procedure.  There is no need for concern and it should clear up in a day or so.  SYMPTOMS TO REPORT IMMEDIATELY:    Following upper endoscopy (EGD)  Vomiting of blood or coffee ground material  New chest pain or pain under the shoulder blades  Painful or persistently difficult swallowing  New shortness of breath  Fever of 100F or higher  Black, tarry-looking stools  For urgent or emergent issues, a gastroenterologist can be reached at any hour by calling [phone].   DIET:  Clear liquids to start at 5:15pm  then soft diet at 6:15 pm. Tomorrow morning you may start your regular diet. Avoid alcohol today.  ACTIVITY:  You should plan  to take it easy for the rest of today and you should NOT DRIVE or use heavy machinery until tomorrow (because of the sedation medicines used during the test).    FOLLOW UP: Our staff will call the number listed on your records 48-72 hours following your procedure to check on you and address any questions or concerns that you may have regarding the information given to you following your procedure. If we do not reach you, we will leave a message.  We will attempt to reach you two times.  During this call, we will ask if you have developed any symptoms of COVID 19. If you develop any symptoms (ie: fever, flu-like symptoms, shortness of breath, cough etc.) before then, please call [phone].  If you test positive for Covid 19 in the 2 weeks post procedure, please call and report this information to [place].    If any biopsies were taken you will be contacted by phone or by letter within the next 1-3 weeks.  Please call us at [phone] if you have not heard about the biopsies in 3 weeks.    SIGNATURES/CONFIDENTIALITY: You and/or your care partner have signed paperwork which will be entered into your electronic medical record.  These signatures attest to the fact that that the information above on your After Visit Summary has been reviewed and is understood.  Full responsibility of the confidentiality of this discharge information lies with you and/or  your care-partner. 

## 2019-03-08 NOTE — Progress Notes (Signed)
Temperature taken by A.M, VS taken by C.W.

## 2019-03-08 NOTE — Progress Notes (Signed)
Pt's states no medical or surgical changes since previsit or office visit. 

## 2019-03-08 NOTE — Op Note (Signed)
[place] Patient Name: Molly Erickson Procedure Date: 03/08/2019 3:56 PM MRN: [id number] Endoscopist: Docia Chuck. Henrene Pastor , MD Age: 68 Referring MD:  Date of Birth: 08/17/1950 Gender: Female Account #: [id number] Procedure:                Upper GI endoscopy with balloon dilation of the                            esophagus, 18-20 mm Indications:              Dysphagia, Therapeutic procedure. Most recent                            dilations July 2019 and September 2019 Medicines:                Monitored Anesthesia Care Procedure:                Pre-Anesthesia Assessment:                           - Prior to the procedure, a History and Physical                            was performed, and patient medications and                            allergies were reviewed. The patient's tolerance of                            previous anesthesia was also reviewed. The risks                            and benefits of the procedure and the sedation                            options and risks were discussed with the patient.                            All questions were answered, and informed consent                            was obtained. Prior Anticoagulants: The patient has                            taken no previous anticoagulant or antiplatelet                            agents. ASA Grade Assessment: II - A patient with                            mild systemic disease. After reviewing the risks                            and benefits, the patient was deemed in  satisfactory condition to undergo the procedure.                           After obtaining informed consent, the endoscope was                            passed under direct vision. Throughout the                            procedure, the patient's blood pressure, pulse, and                            oxygen saturations were monitored continuously. The                            Endoscope was  introduced through the mouth, and                            advanced to the second part of duodenum. The upper                            GI endoscopy was accomplished without difficulty.                            The patient tolerated the procedure well. Scope In: Scope Out: Findings:                 One benign-appearing, intrinsic moderate stenosis                            was found. This stenosis measured 1.4 cm (inner                            diameter). A TTS dilator was passed through the                            scope. Dilation with an 18-19-20 mm balloon dilator                            was performed to 20 mm.                           The exam of the esophagus revealed scarring and                            mild inflammation distally.                           The stomach revealed a moderate hiatal hernia as                            previous but was otherwise normal.                           The examined duodenum was normal.  The cardia and gastric fundus were normal on                            retroflexion. Complications:            No immediate complications. Estimated Blood Loss:     Estimated blood loss: none. Impression:               GERD complicated by erosive esophagitis and peptic                            stricture status post dilation to 20 mm via balloon. Recommendation:           1. Post dilation diet                           2. Prescribe omeprazole 40 mg twice daily; #60; 11                            refills                           3. Please contact Dr. Henrene Pastor if you have ANY further                            problems with swallowing, otherwise routine office                            follow-up 1 year. Docia Chuck. Henrene Pastor, MD 03/08/2019 4:19:41 PM This report has been signed electronically.

## 2019-03-10 ENCOUNTER — Telehealth: Payer: Self-pay | Admitting: *Deleted

## 2019-03-10 NOTE — Telephone Encounter (Signed)
  Follow up Call-  Call back number 03/08/2019 03/18/2018 01/19/2018  Post procedure Call Back phone  # [phone]  Permission to leave phone message Yes Yes Yes  Some recent data might be hidden     Patient questions:  Do you have a fever, pain , or abdominal swelling? No. Pain Score  0 *  Have you tolerated food without any problems? Yes.    Have you been able to return to your normal activities? Yes.    Do you have any questions about your discharge instructions: Diet   No. Medications  No. Follow up visit  No.  Do you have questions or concerns about your Care? No.  Actions: * If pain score is 4 or above: No action needed, pain <4.  1. Have you developed a fever since your procedure? no  2.   Have you had an respiratory symptoms (SOB or cough) since your procedure? no  3.   Have you tested positive for COVID 19 since your procedure no  4.   Have you had any family members/close contacts diagnosed with the COVID 19 since your procedure? no   If yes to any of these questions please route to Joylene John, RN and Alphonsa Gin, Therapist, sports.

## 2019-05-04 ENCOUNTER — Ambulatory Visit (INDEPENDENT_AMBULATORY_CARE_PROVIDER_SITE_OTHER): Payer: 59 | Admitting: Internal Medicine

## 2019-05-04 ENCOUNTER — Encounter: Payer: Self-pay | Admitting: Internal Medicine

## 2019-05-04 DIAGNOSIS — J209 Acute bronchitis, unspecified: Secondary | ICD-10-CM | POA: Diagnosis not present

## 2019-05-04 MED ORDER — AZITHROMYCIN 250 MG PO TABS: ORAL_TABLET | ORAL | 0 refills | Status: DC

## 2019-05-04 NOTE — Progress Notes (Signed)
Virtual Visit via Video Note  I connected with Molly Erickson on 05/04/19 at  3:15 PM EST by a video enabled telemedicine application and verified that I am speaking with the correct person using two identifiers.   I discussed the limitations of evaluation and management by telemedicine and the availability of in person appointments. The patient expressed understanding and agreed to proceed.  Present for the visit:  Myself, Dr Billey Gosling, Bjorn Pippin.  The patient is currently at home and I am in the office.    No referring provider.    History of Present Illness: She is here for an acute visit for cold symptoms.   Her initial symptoms started in October and they were typical cold symptoms.  Those improved.  At that time she did get tested for COVID and it was negative.  Her symptoms never got better.  She is concerned she is developing bronchitis.  She has a deep cough.  She denies any fever.  She has some intermittent sputum production from the cough, sinus pain.   She has tried taking otc cold medications.  She is concerned because of her history of wegener's granulomatosis.     Review of Systems  Constitutional: Negative for fever.  HENT: Positive for sinus pain. Negative for congestion, ear pain and sore throat.   Respiratory: Positive for cough and sputum production (yellow). Negative for shortness of breath and wheezing.   Gastrointestinal: Negative for diarrhea and nausea.  Musculoskeletal: Negative for myalgias.  Neurological: Negative for dizziness and headaches.     Social History   Socioeconomic History  . Marital status: Married    Spouse name: Not on file  . Number of children: Not on file  . Years of education: Not on file  . Highest education level: Not on file  Occupational History  . Not on file  Social Needs  . Financial resource strain: Not on file  . Food insecurity    Worry: Not on file    Inability: Not on file  . Transportation needs   Medical: Not on file    Non-medical: Not on file  Tobacco Use  . Smoking status: Never Smoker  . Smokeless tobacco: Never Used  Substance and Sexual Activity  . Alcohol use: Yes    Alcohol/week: 4.0 standard drinks    Types: 4 Standard drinks or equivalent per week    Comment: socially  . Drug use: No  . Sexual activity: Yes    Birth control/protection: Post-menopausal  Lifestyle  . Physical activity    Days per week: Not on file    Minutes per session: Not on file  . Stress: Not on file  Relationships  . Social Herbalist on phone: Not on file    Gets together: Not on file    Attends religious service: Not on file    Active member of club or organization: Not on file    Attends meetings of clubs or organizations: Not on file    Relationship status: Not on file  Other Topics Concern  . Not on file  Social History Narrative  . Not on file     Observations/Objective: Appears well in NAD Breathing normally, intermittent deep cough Skin appears warm and dry  Assessment and Plan:  See Problem List for Assessment and Plan of chronic medical problems.   Follow Up Instructions:    I discussed the assessment and treatment plan with the patient. The patient was provided an opportunity  to ask questions and all were answered. The patient agreed with the plan and demonstrated an understanding of the instructions.   The patient was advised to call back or seek an in-person evaluation if the symptoms worsen or if the condition fails to improve as anticipated.    Binnie Rail, MD

## 2019-05-04 NOTE — Assessment & Plan Note (Signed)
Symptoms concerning for bronchitis, likely bacterial   COVID test negative zpak otc cold medications Call if no improvement

## 2019-05-18 ENCOUNTER — Ambulatory Visit (INDEPENDENT_AMBULATORY_CARE_PROVIDER_SITE_OTHER): Payer: 59 | Admitting: Internal Medicine

## 2019-05-18 DIAGNOSIS — R062 Wheezing: Secondary | ICD-10-CM | POA: Diagnosis not present

## 2019-05-18 DIAGNOSIS — R059 Cough, unspecified: Secondary | ICD-10-CM

## 2019-05-18 DIAGNOSIS — R109 Unspecified abdominal pain: Secondary | ICD-10-CM | POA: Diagnosis not present

## 2019-05-18 DIAGNOSIS — R05 Cough: Secondary | ICD-10-CM | POA: Diagnosis not present

## 2019-05-18 MED ORDER — LEVOFLOXACIN 500 MG PO TABS: 500.0000 mg | ORAL_TABLET | Freq: Every day | ORAL | 0 refills | Status: AC

## 2019-05-18 NOTE — Progress Notes (Signed)
Patient ID: Molly Erickson, female   DOB: 1950/07/19, 68 y.o.   MRN: [id number]  Virtual Visit via Video Note  I connected with [name] on 05/18/19 at  4:00 PM EST by a video enabled telemedicine application and verified that I am speaking with the correct person using two identifiers.  Location: Patient: at home Provider: at office   I discussed the limitations of evaluation and management by telemedicine and the availability of in person appointments. The patient expressed understanding and agreed to proceed.  History of Present Illness: Here to f/u with persistent cough, seemed to start x 2 wks ago, and somewhat improved with zpack with Dr Quay Burow, seemed better but not resolved and now worsening again it seems scant productive and associated with mild wheezing, sob.  Denies high fever, chills and Pt denies chest pain, orthopnea, PND, increased LE swelling, palpitations, dizziness or syncope.  Also has right flank/lumbar pain mild intermitent x 3 days but Pt denies bowel or bladder change, fever, wt loss,  worsening LE pain/numbness/weakness, gait change or falls.  Note pt COVID neg 3 wks ago  Denies urinary symptoms such as dysuria, frequency, urgency, flank pain, hematuria or n/v, fever, chills. Past Medical History:  Diagnosis Date  . Abscess of finger 11/15/2010  . Allergy   . Blood transfusion without reported diagnosis   . Bronchitis   . Cancer ([place]) 06/2011   Basal cell, Dr Edison Pace , [place]  . CIN I (cervical intraepithelial neoplasia I)   . Fever blister   . GERD (gastroesophageal reflux disease)   . 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 Hematuria   . Hyperlipidemia   . Hypothyroidism   . Neck pain 12/31/2014  . Osteopenia   . Osteoporosis   . Relapsing polychondritis   . Wegener's syndrome [facility])    Past Surgical History:  Procedure Laterality Date  . BREAST BIOPSY     Papilloma-Intraductal  . BREAST LUMPECTOMY     benign  . COLPOSCOPY    .  ESOPHAGOGASTRODUODENOSCOPY (EGD) WITH ESOPHAGEAL DILATION    . LUNG BIOPSY      reports that she has never smoked. She has never used smokeless tobacco. She reports current alcohol use of about 4.0 standard drinks of alcohol per week. She reports that she does not use drugs. family history includes Breast cancer in her maternal aunt and mother; Coronary artery disease in her father; Diabetes in her daughter; Heart disease in her father; Hypertension in her father; Leukemia in her mother; Prostate cancer in her father; Skin cancer in her father. No Known Allergies  Current Outpatient Medications on File Prior to Visit  Medication Sig Dispense Refill  . acyclovir (ZOVIRAX) 200 MG capsule Take 1 capsule (200 mg total) by mouth 5 (five) times daily. 25 capsule 0  . azithromycin (ZITHROMAX) 250 MG tablet Take two tabs the first day and then one tab daily for four days 6 tablet 0  . Cholecalciferol (VITAMIN D3) 125 MCG (5000 UT) CAPS Take by mouth daily.    . Famotidine (PEPCID PO) Take by mouth daily.     Marland Kitchen liothyronine (CYTOMEL) 25 MCG tablet Take 15 mcg by mouth daily. Takes 2 tabs in am and 1 tab in pm    . omeprazole (PRILOSEC) 40 MG capsule Take 1 capsule (40 mg total) by mouth 2 (two) times daily. 60 capsule 11  . raloxifene (EVISTA) 60 MG tablet     . SYNTHROID 88 MCG tablet  No current facility-administered medications on file prior to visit.     Observations/Objective: Alert, NAD, appropriate mood and affect, resps normal, cn 2-12 intact, moves all 4s, no visible rash or swelling Lab Results  Component Value Date   WBC 5.6 04/16/2016   HGB 12.3 04/16/2016   HCT 39 04/16/2016   PLT 315 04/16/2016   GLUCOSE 101 (H) 01/07/2010   CHOL 166 04/16/2016   TRIG 104 04/16/2016   HDL 43 04/16/2016   LDLDIRECT 161.3 01/07/2010   LDLCALC 102 04/16/2016   ALT 8 04/16/2016   AST 14 04/16/2016   NA 143 04/16/2016   K 4.7 04/16/2016   CL 109 01/07/2010   CREATININE 0.7 04/16/2016   BUN  11 04/16/2016   CO2 27 01/07/2010   TSH 3.08 09/17/2011   HGBA1C 5.8 10/18/2008   Assessment and Plan: See notes  Follow Up Instructions: See notes   I discussed the assessment and treatment plan with the patient. The patient was provided an opportunity to ask questions and all were answered. The patient agreed with the plan and demonstrated an understanding of the instructions.   The patient was advised to call back or seek an in-person evaluation if the symptoms worsen or if the condition fails to improve as anticipated   Cathlean Cower, MD

## 2019-05-20 ENCOUNTER — Encounter: Payer: Self-pay | Admitting: Internal Medicine

## 2019-05-20 DIAGNOSIS — R062 Wheezing: Secondary | ICD-10-CM | POA: Insufficient documentation

## 2019-05-20 DIAGNOSIS — R109 Unspecified abdominal pain: Secondary | ICD-10-CM | POA: Insufficient documentation

## 2019-05-20 DIAGNOSIS — R05 Cough: Secondary | ICD-10-CM | POA: Insufficient documentation

## 2019-05-20 DIAGNOSIS — R059 Cough, unspecified: Secondary | ICD-10-CM | POA: Insufficient documentation

## 2019-05-20 NOTE — Assessment & Plan Note (Signed)
Etiology unclear, cant r/o pna, for levaquin asd,  to f/u any worsening symptoms or concerns

## 2019-05-20 NOTE — Patient Instructions (Signed)
Please take all new medication as prescribed  Please continue all other medications as before, and refills have been done if requested.  Please have the pharmacy call with any other refills you may need.  Please keep your appointments with your specialists as you may have planned     

## 2019-05-20 NOTE — Assessment & Plan Note (Signed)
Mild, ? Msk, declines muscle relaxer prn, should consider UA and further evaluation for persistent or worsening

## 2019-05-20 NOTE — Assessment & Plan Note (Signed)
Very mild, wit slight sob, for predpac asd,  to f/u any worsening symptoms or concerns

## 2019-06-23 ENCOUNTER — Ambulatory Visit (INDEPENDENT_AMBULATORY_CARE_PROVIDER_SITE_OTHER): Payer: 59 | Admitting: Internal Medicine

## 2019-06-23 ENCOUNTER — Encounter: Payer: Self-pay | Admitting: Internal Medicine

## 2019-06-23 DIAGNOSIS — Z20828 Contact with and (suspected) exposure to other viral communicable diseases: Secondary | ICD-10-CM

## 2019-06-23 DIAGNOSIS — Z20822 Contact with and (suspected) exposure to covid-19: Secondary | ICD-10-CM | POA: Insufficient documentation

## 2019-06-23 NOTE — Assessment & Plan Note (Signed)
Sent link to schedule testing for Monday. Needs to quarantine for 14 days. Reinforced need to wear mask even when gathering outdoors or when with those outside of your household. Counseled about information about vaccine and timeline as known.

## 2019-06-23 NOTE — Progress Notes (Signed)
Virtual Visit via Video Note  I connected with [name] on 06/23/19 at  1:00 PM EST by a video enabled telemedicine application and verified that I am speaking with the correct person using two identifiers.  The patient and the provider were at separate locations throughout the entire encounter.   I discussed the limitations of evaluation and management by telemedicine and the availability of in person appointments. The patient expressed understanding and agreed to proceed. The patient and the provider were the only parties present for the visit unless noted in HPI below.  History of Present Illness: The patient is a 68 y.o. female with visit for exposure to covid-19. She and husband had an outdoor gathering with another couple on 06/20/19. They have since found out that they are positive for covid-19. It was outdoors but no one was wearing masks and the gathering lasted several hours. They were drinking liquids. No one appeared to be sick during visit and no coughing. Has no current symptoms of covid-19.   Observations/Objective: Appearance: normal, breathing appears normal, casual grooming, abdomen does not appear distended, throat normal, memory normal, mental status is A and O times 3  Assessment and Plan: See problem oriented charting  Follow Up Instructions: schedule testing for covid-19 for Monday, quarantine for 14 days from exposure, counseled about covid-19 and vaccine  Visit time 25 minutes: greater than 50% of that time was spent in face to face counseling and coordination of care with the patient: counseled about covid-19 exposure, need to quarantine, standards for protecting self from exposure to covid-19, vaccine timeline and where to get accurate information from state  I discussed the assessment and treatment plan with the patient. The patient was provided an opportunity to ask questions and all were answered. The patient agreed with the plan and demonstrated an  understanding of the instructions.   The patient was advised to call back or seek an in-person evaluation if the symptoms worsen or if the condition fails to improve as anticipated.  Hoyt Koch, MD

## 2019-06-27 ENCOUNTER — Ambulatory Visit: Payer: 59 | Attending: Internal Medicine

## 2019-06-27 DIAGNOSIS — Z20822 Contact with and (suspected) exposure to covid-19: Secondary | ICD-10-CM

## 2019-06-28 LAB — NOVEL CORONAVIRUS, NAA: SARS-CoV-2, NAA: NOT DETECTED

## 2019-07-11 ENCOUNTER — Other Ambulatory Visit: Payer: No Typology Code available for payment source

## 2019-07-14 ENCOUNTER — Ambulatory Visit: Payer: 59 | Attending: Internal Medicine

## 2019-07-14 DIAGNOSIS — Z23 Encounter for immunization: Secondary | ICD-10-CM

## 2019-07-14 NOTE — Progress Notes (Signed)
   Covid-19 Vaccination Clinic  Name:  Molly Erickson    MRN: [id number] DOB: February 15, 1951  07/14/2019  [name] was observed post Covid-19 immunization for 15 minutes without incidence. She was provided with Vaccine Information Sheet and instruction to access the V-Safe system.   [name] was instructed to call 911 with any severe reactions post vaccine: Marland Kitchen Difficulty breathing  . Swelling of your face and throat  . A fast heartbeat  . A bad rash all over your body  . Dizziness and weakness    Immunizations Administered    Name Date Dose VIS Date Route   Pfizer COVID-19 Vaccine 07/14/2019  2:58 PM 0.3 mL 06/03/2019 Intramuscular   Manufacturer: [place]   Lot: [id number]   [place]: [id number]

## 2019-07-28 ENCOUNTER — Ambulatory Visit: Payer: No Typology Code available for payment source

## 2019-08-01 ENCOUNTER — Ambulatory Visit: Payer: 59 | Attending: Internal Medicine

## 2019-08-01 DIAGNOSIS — Z23 Encounter for immunization: Secondary | ICD-10-CM | POA: Insufficient documentation

## 2019-08-01 NOTE — Progress Notes (Signed)
   Covid-19 Vaccination Clinic  Name:  DEYRA BROUWER    MRN: [id number] DOB: [birth year]  08/01/2019  Ms. Holiman was observed post Covid-19 immunization for 15 minutes without incidence. She was provided with Vaccine Information Sheet and instruction to access the V-Safe system.   Ms. Laury was instructed to call 911 with any severe reactions post vaccine: Marland Kitchen Difficulty breathing  . Swelling of your face and throat  . A fast heartbeat  . A bad rash all over your body  . Dizziness and weakness    Immunizations Administered    Name Date Dose VIS Date Route   Pfizer COVID-19 Vaccine 08/01/2019  8:32 AM 0.3 mL 06/03/2019 Intramuscular   Manufacturer: [place]   Lot: [id number]   [place]: [id number]

## 2019-10-21 ENCOUNTER — Other Ambulatory Visit: Payer: Self-pay | Admitting: Internal Medicine

## 2019-10-21 DIAGNOSIS — Z0184 Encounter for antibody response examination: Secondary | ICD-10-CM

## 2019-10-24 ENCOUNTER — Other Ambulatory Visit: Payer: No Typology Code available for payment source

## 2019-10-24 ENCOUNTER — Other Ambulatory Visit: Payer: Self-pay

## 2019-10-24 DIAGNOSIS — Z0184 Encounter for antibody response examination: Secondary | ICD-10-CM

## 2019-10-25 ENCOUNTER — Encounter: Payer: Self-pay | Admitting: Internal Medicine

## 2019-10-25 LAB — SAR COV2 SEROLOGY (COVID19)AB(IGG),IA: DiaSorin SARS-CoV-2 Ab, IgG: POSITIVE

## 2020-03-10 ENCOUNTER — Other Ambulatory Visit: Payer: Self-pay | Admitting: Internal Medicine

## 2020-07-02 ENCOUNTER — Other Ambulatory Visit: Payer: Self-pay

## 2020-07-02 ENCOUNTER — Telehealth: Payer: Self-pay

## 2020-07-02 DIAGNOSIS — R1310 Dysphagia, unspecified: Secondary | ICD-10-CM

## 2020-07-02 NOTE — Telephone Encounter (Signed)
Called patient and offered slot for EGD w/Dilation on 07/03/20. She stated they were going out of town today and would be gone all week. The next opening in the Endo Unit with Dr. Henrene Pastor is 07/25/20. I asked her if she would be OK until then, and she said she would be fine. Scheduled her on  07/25/20. Letter with instructions sent via My Chart (per patient request). Patient states she has been taking Ompeprazole-40mg  QD and will continue to until her procedure.

## 2020-07-02 NOTE — Telephone Encounter (Signed)
Thank you Esther 

## 2020-07-02 NOTE — Telephone Encounter (Signed)
-----   Message from Irene Shipper, MD sent at 07/02/2020 12:01 PM EST ----- Regarding: EGD dilation analysis Vaughan Basta,  This is a friend and patient of mine.  Her husband spoke with me over the weekend and informed me that she was having significant dysphagia.  She has a history of peptic stricture requiring esophageal dilation.  I Recommend the following:  1.  Please contact the patient ASAP and let her know that I have reviewed her chart this morning. 2.  We may skip the office evaluation and move straight to endoscopy 3.  Schedule EGD in the [facility] with dilation.  I have an opening as soon as tomorrow at 10 AM.  She has been vaccinated. 4. She should be on omeprazole 40 mg daily.  If she is not, please prescribe and tell her to take omeprazole 40 mg daily  Let me know the plan after speaking with the patient regarding all of the above.  Thanks  Dr. Henrene Pastor

## 2020-07-17 ENCOUNTER — Encounter: Payer: Self-pay | Admitting: Internal Medicine

## 2020-07-17 ENCOUNTER — Telehealth (INDEPENDENT_AMBULATORY_CARE_PROVIDER_SITE_OTHER): Payer: Commercial Managed Care - PPO | Admitting: Internal Medicine

## 2020-07-17 DIAGNOSIS — U071 COVID-19: Secondary | ICD-10-CM

## 2020-07-17 NOTE — Assessment & Plan Note (Signed)
Acute Symptoms started yesterday, 07/16/2020 Home test + 07/16/2020 Symptoms relatively mild Discussed treatment options now or in the future if her symptoms worsen At this point she will continue over-the-counter cold medications and supplements, rest, fluids If her symptoms worsen or if she is any question she will contact me She knows to quarantine for minimum of 5 days, longer if her symptoms are getting worse or not improving

## 2020-07-17 NOTE — Progress Notes (Signed)
Virtual Visit via Video Note  I connected with Molly Erickson on 07/17/20 at  3:15 PM EST by a video enabled telemedicine application and verified that I am speaking with the correct person using two identifiers.   I discussed the limitations of evaluation and management by telemedicine and the availability of in person appointments. The patient expressed understanding and agreed to proceed.  Present for the visit:  Myself, Dr Billey Gosling, Bjorn Pippin.  The patient is currently at home and I am in the office.    No referring provider.    History of Present Illness: This is an acute visit for being covid positive.   Yesterday woke up with ST -she felt fine before that.  Started started getting chills yesterday,, but did not have a fever. Last night in the middle the night when she got up to the bathroom she felt very chilled. She states it feels like a mild flu.  She tested herself this morning because of the sore throat and she was because she was having autoimmune disease. She did test positive. Her husband is also positive.  Besides chills and sore throat she has mild nasal congestion and sinus pressure. She has an occasional cough from what she thinks is postnasal drip. She has mild lightheadedness. She denies any shortness of breath, wheezing or headaches.  She is taking supplements and has taken ibuprofen.  Review of Systems  Constitutional: Positive for chills. Negative for fever.  HENT: Positive for congestion, sinus pain and sore throat.        No loss of taste or smell  Respiratory: Positive for cough (occ from PND). Negative for shortness of breath and wheezing.   Neurological: Negative for headaches.       Mild lightheadedness      Social History   Socioeconomic History  . Marital status: Married    Spouse name: Not on file  . Number of children: Not on file  . Years of education: Not on file  . Highest education level: Not on file  Occupational History  .  Not on file  Tobacco Use  . Smoking status: Never Smoker  . Smokeless tobacco: Never Used  Vaping Use  . Vaping Use: Never used  Substance and Sexual Activity  . Alcohol use: Yes    Alcohol/week: 4.0 standard drinks    Types: 4 Standard drinks or equivalent per week    Comment: socially  . Drug use: No  . Sexual activity: Yes    Birth control/protection: Post-menopausal  Other Topics Concern  . Not on file  Social History Narrative  . Not on file   Social Determinants of Health   Financial Resource Strain: Not on file  Food Insecurity: Not on file  Transportation Needs: Not on file  Physical Activity: Not on file  Stress: Not on file  Social Connections: Not on file     Observations/Objective: Appears well in NAD Breathing normally Skin appears warm and dry  Assessment and Plan:  See Problem List for Assessment and Plan of chronic medical problems.   Follow Up Instructions:    I discussed the assessment and treatment plan with the patient. The patient was provided an opportunity to ask questions and all were answered. The patient agreed with the plan and demonstrated an understanding of the instructions.   The patient was advised to call back or seek an in-person evaluation if the symptoms worsen or if the condition fails to improve as anticipated.  Binnie Rail, MD

## 2020-07-24 HISTORY — PX: UPPER GASTROINTESTINAL ENDOSCOPY: SHX188

## 2020-07-25 ENCOUNTER — Encounter: Payer: No Typology Code available for payment source | Admitting: Internal Medicine

## 2020-07-27 ENCOUNTER — Telehealth: Payer: Self-pay

## 2020-07-27 NOTE — Telephone Encounter (Signed)
Pt is scheduled to see Dr. Henrene Pastor on 07/31/20 at 3:30pm. Pt apparently tested positive for covid on jan 25. She had a televisit with PCP last week and stated she was experiencing chills, sore throat, and nasal congestion and sinus pressure. Tried to contact pt to follow up on covid symptoms, to see if she had any residual symptoms. I was unable to reach patient. Will try to contact pt again before end of day.

## 2020-07-30 NOTE — Telephone Encounter (Signed)
Okay to keep endoscopy as planned.  Thank you for the update

## 2020-07-30 NOTE — Telephone Encounter (Signed)
Spoke with patient today, she confirmed that she tested positive on jan 24. She has been vaccinated and boosted. It has been over 10 days since positive test and she stated that she is not having any residual covid symptoms.

## 2020-07-31 ENCOUNTER — Encounter: Payer: Self-pay | Admitting: Internal Medicine

## 2020-07-31 ENCOUNTER — Ambulatory Visit (AMBULATORY_SURGERY_CENTER): Payer: PRIVATE HEALTH INSURANCE | Admitting: Internal Medicine

## 2020-07-31 ENCOUNTER — Other Ambulatory Visit: Payer: Self-pay

## 2020-07-31 VITALS — BP 152/73 | HR 58 | Temp 97.5°F | Resp 13

## 2020-07-31 DIAGNOSIS — K219 Gastro-esophageal reflux disease without esophagitis: Secondary | ICD-10-CM | POA: Diagnosis not present

## 2020-07-31 DIAGNOSIS — R1310 Dysphagia, unspecified: Secondary | ICD-10-CM

## 2020-07-31 DIAGNOSIS — K222 Esophageal obstruction: Secondary | ICD-10-CM

## 2020-07-31 MED ORDER — SODIUM CHLORIDE 0.9 % IV SOLN: 500.0000 mL | INTRAVENOUS | Status: DC

## 2020-07-31 MED ORDER — OMEPRAZOLE 40 MG PO CPDR: 40.0000 mg | DELAYED_RELEASE_CAPSULE | Freq: Two times a day (BID) | ORAL | 12 refills | Status: DC

## 2020-07-31 NOTE — Patient Instructions (Signed)
Follow post dilation diet Start omeprazole 40mg  twice daily : take with only water, at least 30 minutes before eating Schedule repeat upper endoscopy with dilation in 4-6 weeks  YOU HAD AN ENDOSCOPIC PROCEDURE TODAY AT [facility]:   Refer to the procedure report that was given to you for any specific questions about what was found during the examination.  If the procedure report does not answer your questions, please call your gastroenterologist to clarify.  If you requested that your care partner not be given the details of your procedure findings, then the procedure report has been included in a sealed envelope for you to review at your convenience later.  YOU SHOULD EXPECT: Some feelings of bloating in the abdomen. Passage of more gas than usual.  Walking can help get rid of the air that was put into your GI tract during the procedure and reduce the bloating. If you had a lower endoscopy (such as a colonoscopy or flexible sigmoidoscopy) you may notice spotting of blood in your stool or on the toilet paper. If you underwent a bowel prep for your procedure, you may not have a normal bowel movement for a few days.  Please Note:  You might notice some irritation and congestion in your nose or some drainage.  This is from the oxygen used during your procedure.  There is no need for concern and it should clear up in a day or so.  SYMPTOMS TO REPORT IMMEDIATELY:   Following upper endoscopy (EGD)  Vomiting of blood or coffee ground material  New chest pain or pain under the shoulder blades  Painful or persistently difficult swallowing  New shortness of breath  Fever of 100F or higher  Black, tarry-looking stools  For urgent or emergent issues, a gastroenterologist can be reached at any hour by calling [phone]. Do not use MyChart messaging for urgent concerns.   DIET:  We do recommend a small meal at first, but then you may proceed to your regular diet.  Drink plenty of  fluids but you should avoid alcoholic beverages for 24 hours.  ACTIVITY:  You should plan to take it easy for the rest of today and you should NOT DRIVE or use heavy machinery until tomorrow (because of the sedation medicines used during the test).    FOLLOW UP: Our staff will call the number listed on your records 48-72 hours following your procedure to check on you and address any questions or concerns that you may have regarding the information given to you following your procedure. If we do not reach you, we will leave a message.  We will attempt to reach you two times.  During this call, we will ask if you have developed any symptoms of COVID 19. If you develop any symptoms (ie: fever, flu-like symptoms, shortness of breath, cough etc.) before then, please call [phone].  If you test positive for Covid 19 in the 2 weeks post procedure, please call and report this information to [place].    If any biopsies were taken you will be contacted by phone or by letter within the next 1-3 weeks.  Please call us at [phone] if you have not heard about the biopsies in 3 weeks.   SIGNATURES/CONFIDENTIALITY: You and/or your care partner have signed paperwork which will be entered into your electronic medical record.  These signatures attest to the fact that that the information above on your After Visit Summary has been reviewed and is understood.  Full  responsibility of the confidentiality of this discharge information lies with you and/or your care-partner.

## 2020-07-31 NOTE — Progress Notes (Signed)
Report to PACU, RN, vss, BBS= Clear.  

## 2020-07-31 NOTE — Op Note (Signed)
[facility] Patient Name: Molly Erickson Procedure Date: 07/31/2020 3:39 PM MRN: [id number] Endoscopist: Docia Chuck. Henrene Pastor , MD Age: 70 Referring MD:  Date of Birth: 03/24/1951 Gender: Female Account #: [id number] Procedure:                Upper GI endoscopy with balloon dilation of the                            esophagus. 19 mm max Indications:              Dysphagia Medicines:                Monitored Anesthesia Care Procedure:                Pre-Anesthesia Assessment:                           - Prior to the procedure, a History and Physical                            was performed, and patient medications and                            allergies were reviewed. The patient's tolerance of                            previous anesthesia was also reviewed. The risks                            and benefits of the procedure and the sedation                            options and risks were discussed with the patient.                            All questions were answered, and informed consent                            was obtained. Prior Anticoagulants: The patient has                            taken no previous anticoagulant or antiplatelet                            agents. ASA Grade Assessment: II - A patient with                            mild systemic disease. After reviewing the risks                            and benefits, the patient was deemed in                            satisfactory condition to undergo the procedure.  After obtaining informed consent, the endoscope was                            passed under direct vision. Throughout the                            procedure, the patient's blood pressure, pulse, and                            oxygen saturations were monitored continuously. The                            Endoscope was introduced through the mouth, and                            advanced to the second part of duodenum. The  upper                            GI endoscopy was accomplished without difficulty.                            The patient tolerated the procedure well. Scope In: Scope Out: Findings:                 One benign-appearing, intrinsic moderate stenosis                            was found 35 cm from the incisors. This stenosis                            measured 1.2 cm (inner diameter). There was                            associated esophagitis. The stenosis was traversed.                            A TTS dilator was passed through the scope.                            Dilation with an 18-19-20 mm balloon dilator was                            performed to 19 mm.                           The stomach revealed a 5 cm hiatal hernia but was                            otherwise normal.                           The examined duodenum was normal.                           The cardia and gastric  fundus were normal on                            retroflexion, save the hiatal hernia. Complications:            No immediate complications. Estimated Blood Loss:     Estimated blood loss: none. Impression:               1. GERD complicated by esophagitis and high-grade                            peptic stricture status post balloon dilation to 19                            mm                           2. Hiatal hernia. Otherwise normal EGD. Recommendation:           1. Patient has a contact number available for                            emergencies. The signs and symptoms of potential                            delayed complications were discussed with the                            patient. Return to normal activities tomorrow.                            Written discharge instructions were provided to the                            patient.                           2. Post dilation diet.                           3. Prescribe omeprazole 40 mg; #60; take 1 by mouth                            twice  daily                           4. Schedule repeat upper endoscopy with esophageal                            dilation in 4 to 6 weeks. Docia Chuck. Henrene Pastor, MD 07/31/2020 4:14:17 PM This report has been signed electronically.

## 2020-07-31 NOTE — Progress Notes (Signed)
SB Iv and Vitals.

## 2020-07-31 NOTE — Progress Notes (Signed)
Called to room to assist during endoscopic procedure.  Patient ID and intended procedure confirmed with present staff. Received instructions for my participation in the procedure from the performing physician.  

## 2020-08-02 ENCOUNTER — Telehealth: Payer: Self-pay

## 2020-08-02 NOTE — Telephone Encounter (Signed)
  Follow up Call-  Call back number 07/31/2020 03/08/2019 03/18/2018 01/19/2018  Post procedure Call Back phone  # [phone]  Permission to leave phone message Yes Yes Yes Yes  Some recent data might be hidden     Patient questions:  Do you have a fever, pain , or abdominal swelling? No. Pain Score  0 *  Have you tolerated food without any problems? Yes.    Have you been able to return to your normal activities? Yes.    Do you have any questions about your discharge instructions: Diet   No. Medications  No. Follow up visit  No.  Do you have questions or concerns about your Care? No.  Actions: * If pain score is 4 or above: No action needed, pain <4. 1. Have you developed a fever since your procedure? no  2.   Have you had an respiratory symptoms (SOB or cough) since your procedure? no  3.   Have you tested positive for COVID 19 since your procedure no  4.   Have you had any family members/close contacts diagnosed with the COVID 19 since your procedure?  no   If yes to any of these questions please route to Joylene John, RN and Joella Prince, RN

## 2020-08-24 ENCOUNTER — Other Ambulatory Visit: Payer: Self-pay

## 2020-08-24 ENCOUNTER — Ambulatory Visit (AMBULATORY_SURGERY_CENTER): Payer: PRIVATE HEALTH INSURANCE

## 2020-08-24 VITALS — Ht 67.0 in | Wt 165.0 lb

## 2020-08-24 DIAGNOSIS — K219 Gastro-esophageal reflux disease without esophagitis: Secondary | ICD-10-CM

## 2020-08-24 DIAGNOSIS — K222 Esophageal obstruction: Secondary | ICD-10-CM

## 2020-08-24 NOTE — Progress Notes (Signed)
Pre visit completed via phone call;  Patient verified, name, DOB, and address; No egg or soy allergy known to patient  No issues with past sedation with any surgeries or procedures No intubation problems in the past  No FH of Malignant Hyperthermia No diet pills per patient No home 02 use per patient  No blood thinners per patient  Pt denies issues with constipation  No A fib or A flutter  EMMI video via [place] 19 guidelines implemented in PV today with Pt and RN;   Pt denies loose or missing teeth, dentures, partials, dental implants, capped or bonded teeth;  Discussed with pt there will be an out-of-pocket cost for prep and that varies from $0 to 70 dollars  Due to the COVID-19 pandemic we are asking patients to follow certain guidelines.   Pt aware of COVID protocols and [place] guidelines   Patient reports she does not want information mailed to her. Patient requesting to have information sent to her MyChart. Information sent per pt request;

## 2020-09-11 ENCOUNTER — Encounter: Payer: Self-pay | Admitting: Internal Medicine

## 2020-09-18 ENCOUNTER — Encounter: Payer: Self-pay | Admitting: Internal Medicine

## 2020-09-18 ENCOUNTER — Ambulatory Visit (AMBULATORY_SURGERY_CENTER): Payer: PRIVATE HEALTH INSURANCE | Admitting: Internal Medicine

## 2020-09-18 ENCOUNTER — Other Ambulatory Visit: Payer: Self-pay

## 2020-09-18 VITALS — BP 106/61 | HR 61 | Temp 97.9°F | Resp 11 | Ht 67.0 in | Wt 165.0 lb

## 2020-09-18 DIAGNOSIS — R1310 Dysphagia, unspecified: Secondary | ICD-10-CM | POA: Diagnosis not present

## 2020-09-18 DIAGNOSIS — K222 Esophageal obstruction: Secondary | ICD-10-CM

## 2020-09-18 DIAGNOSIS — K219 Gastro-esophageal reflux disease without esophagitis: Secondary | ICD-10-CM

## 2020-09-18 MED ORDER — SODIUM CHLORIDE 0.9 % IV SOLN: 500.0000 mL | Freq: Once | INTRAVENOUS | Status: DC

## 2020-09-18 NOTE — Progress Notes (Signed)
Report to PACU, RN, vss, BBS= Clear.  

## 2020-09-18 NOTE — Progress Notes (Signed)
Pt's states no medical or surgical changes since previsit or office visit.   CHECK-IN-AER  Vital signs-SB

## 2020-09-18 NOTE — Progress Notes (Signed)
Called to room to assist during endoscopic procedure.  Patient ID and intended procedure confirmed with present staff. Received instructions for my participation in the procedure from the performing physician.  

## 2020-09-18 NOTE — Op Note (Signed)
[facility] Patient Name: Molly Erickson Procedure Date: 09/18/2020 10:23 AM MRN: [id number] Endoscopist: Docia Chuck. Henrene Pastor , MD Age: 70 Referring MD:  Date of Birth: [birth year] Gender: Female Account #: [id number] Procedure:                Upper GI endoscopy with balloon dilation of the                            esophagus. 20 mm max Indications:              Therapeutic procedure, Dysphagia. Known esophageal                            stricture. Previous dilation July 31, 2020 (19                            mm maximal dilation) Medicines:                Monitored Anesthesia Care Procedure:                Pre-Anesthesia Assessment:                           - Prior to the procedure, a History and Physical                            was performed, and patient medications and                            allergies were reviewed. The patient's tolerance of                            previous anesthesia was also reviewed. The risks                            and benefits of the procedure and the sedation                            options and risks were discussed with the patient.                            All questions were answered, and informed consent                            was obtained. Prior Anticoagulants: The patient has                            taken no previous anticoagulant or antiplatelet                            agents. ASA Grade Assessment: II - A patient with                            mild systemic disease. After reviewing the risks  and benefits, the patient was deemed in                            satisfactory condition to undergo the procedure.                           After obtaining informed consent, the endoscope was                            passed under direct vision. Throughout the                            procedure, the patient's blood pressure, pulse, and                            oxygen saturations were monitored  continuously. The                            Endoscope was introduced through the mouth, and                            advanced to the second part of duodenum. The upper                            GI endoscopy was accomplished without difficulty.                            The patient tolerated the procedure well. Scope In: Scope Out: Findings:                 One benign-appearing, intrinsic moderate stenosis                            was found 30 cm from the incisors. This stenosis                            was ringlike and densely fibrotic and measured 1.4                            cm (inner diameter). There was mild inflammation                            present. Overall improved appearance from previous                            endoscopy. The stenosis was traversed. After                            completing the entire endoscopic survey, a TTS                            dilator was passed through the scope. Dilation with  an 18-19-20 mm balloon dilator was performed to 20                            mm. There was a stricture disruption with minor                            bleeding with the largest diameter.                           The stomach was normal, save moderate to large                            sliding hiatal hernia.                           The examined duodenum was normal.                           The cardia and gastric fundus were normal on                            retroflexion. Complications:            No immediate complications. Estimated Blood Loss:     Estimated blood loss: none. Impression:               - Benign-appearing esophageal stenosis. Dilated.                           - Normal stomach, save moderate to large hiatal                            hernia.                           - Normal examined duodenum.                           - No specimens collected. Recommendation:           1. Patient has a contact number  available for                            emergencies. The signs and symptoms of potential                            delayed complications were discussed with the                            patient. Return to normal activities tomorrow.                            Written discharge instructions were provided to the                            patient.  2. Post dilation diet.                           3. Continue present medications.                           4. Repeat EGD with dilation in the [place] in about 3                            months Berry Godsey N. Henrene Pastor, MD 09/18/2020 10:44:23 AM This report has been signed electronically.

## 2020-09-18 NOTE — Patient Instructions (Signed)
DIET:  Follow post dilation diet.   YOU HAD AN ENDOSCOPIC PROCEDURE TODAY AT [facility]:   Refer to the procedure report that was given to you for any specific questions about what was found during the examination.  If the procedure report does not answer your questions, please call your gastroenterologist to clarify.  If you requested that your care partner not be given the details of your procedure findings, then the procedure report has been included in a sealed envelope for you to review at your convenience later.  YOU SHOULD EXPECT: Some feelings of bloating in the abdomen. Passage of more gas than usual.  Walking can help get rid of the air that was put into your GI tract during the procedure and reduce the bloating. If you had a lower endoscopy (such as a colonoscopy or flexible sigmoidoscopy) you may notice spotting of blood in your stool or on the toilet paper. If you underwent a bowel prep for your procedure, you may not have a normal bowel movement for a few days.  Please Note:  You might notice some irritation and congestion in your nose or some drainage.  This is from the oxygen used during your procedure.  There is no need for concern and it should clear up in a day or so.  SYMPTOMS TO REPORT IMMEDIATELY:    Following upper endoscopy (EGD)  Vomiting of blood or coffee ground material  New chest pain or pain under the shoulder blades  Painful or persistently difficult swallowing  New shortness of breath  Fever of 100F or higher  Black, tarry-looking stools  For urgent or emergent issues, a gastroenterologist can be reached at any hour by calling [phone]. Do not use MyChart messaging for urgent concerns.    ACTIVITY:  You should plan to take it easy for the rest of today and you should NOT DRIVE or use heavy machinery until tomorrow (because of the sedation medicines used during the test).    FOLLOW UP: Our staff will call the number listed on your  records 48-72 hours following your procedure to check on you and address any questions or concerns that you may have regarding the information given to you following your procedure. If we do not reach you, we will leave a message.  We will attempt to reach you two times.  During this call, we will ask if you have developed any symptoms of COVID 19. If you develop any symptoms (ie: fever, flu-like symptoms, shortness of breath, cough etc.) before then, please call [phone].  If you test positive for Covid 19 in the 2 weeks post procedure, please call and report this information to [place].    If any biopsies were taken you will be contacted by phone or by letter within the next 1-3 weeks.  Please call us at [phone] if you have not heard about the biopsies in 3 weeks.    SIGNATURES/CONFIDENTIALITY: You and/or your care partner have signed paperwork which will be entered into your electronic medical record.  These signatures attest to the fact that that the information above on your After Visit Summary has been reviewed and is understood.  Full responsibility of the confidentiality of this discharge information lies with you and/or your care-partner.

## 2020-09-19 ENCOUNTER — Encounter: Payer: Self-pay | Admitting: Internal Medicine

## 2020-09-19 DIAGNOSIS — K222 Esophageal obstruction: Secondary | ICD-10-CM | POA: Insufficient documentation

## 2020-09-20 ENCOUNTER — Telehealth: Payer: Self-pay

## 2020-09-20 NOTE — Telephone Encounter (Signed)
  Follow up Call-  Call back number 09/18/2020 07/31/2020 03/08/2019 03/18/2018 01/19/2018  Post procedure Call Back phone  # [phone]  Permission to leave phone message Yes Yes Yes Yes Yes  Some recent data might be hidden     Patient questions:  Do you have a fever, pain , or abdominal swelling? No. Pain Score  0 *  Have you tolerated food without any problems? Yes.    Have you been able to return to your normal activities? Yes.    Do you have any questions about your discharge instructions: Diet   No. Medications  No. Follow up visit  No.  Do you have questions or concerns about your Care? No.  Actions: * If pain score is 4 or above: No action needed, pain <4. 1. Have you developed a fever since your procedure? no  2.   Have you had an respiratory symptoms (SOB or cough) since your procedure? no  3.   Have you tested positive for COVID 19 since your procedure no  4.   Have you had any family members/close contacts diagnosed with the COVID 19 since your procedure?  no   If yes to any of these questions please route to Joylene John, RN and Joella Prince, RN

## 2020-12-19 ENCOUNTER — Encounter: Payer: Self-pay | Admitting: Internal Medicine

## 2020-12-19 NOTE — Progress Notes (Signed)
Subjective:    Patient ID: Molly Erickson, female    DOB: [birth year], 69 y.o.   MRN: [id number]  HPI The patient is here for an acute visit for cough.  She took a covid test and it was negative.     Last Wednesday she started with a dry cough.  It has gotten worse.  A couple of days ago she felt sinus pressure.  The cough has been productive of yellow mucus at times.  No blood.  She has nasal congestion with clear mucus, mild slight wheeze, chest pain.  She denies any fevers, headaches, sore throat or shortness of breath.  Last week she had right knee pain and it went away.  She noticed her right knee last night and it still hurts.  It is not swollen.  It is very painful.     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  She has not had any issues since then.  She thinks most likely she just has a cold and coincidental knee pain, but the symptoms do remind her of the Wegener 'sshe had years ago      Medications and allergies reviewed with patient and updated if appropriate.  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 25 capsule 0   Cholecalciferol (VITAMIN D3) 125 MCG (5000 UT) CAPS Take by mouth  daily.     Famotidine (PEPCID PO) Take by mouth daily.      liothyronine (CYTOMEL) 5 MCG tablet Take 45 mcg by mouth daily.     omeprazole (PRILOSEC) 40 MG capsule Take 1 capsule (40 mg total) by mouth 2 (two) times daily before a meal. At least 30 min before meals 60 capsule 12   raloxifene (EVISTA) 60 MG tablet      SYNTHROID 88 MCG tablet      No current facility-administered medications on file prior to visit.    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[facility])    Hematuria    Hyperlipidemia    Hypothyroidism    Neck pain 12/31/2014   Osteopenia    Osteoporosis    Wegener's syndrome     Past Surgical History:  Procedure Laterality Date   BREAST BIOPSY     Papilloma-Intraductal   BREAST LUMPECTOMY  benign   COLONOSCOPY     COLPOSCOPY     ESOPHAGOGASTRODUODENOSCOPY (EGD) WITH ESOPHAGEAL DILATION     LUNG BIOPSY     UPPER GASTROINTESTINAL ENDOSCOPY  07/2020   JP-MAC-moderate stenosis/esophagitis   WISDOM TOOTH EXTRACTION      Social History   Socioeconomic History   Marital status: Married    Spouse name: Not on file   Number of children: Not on file   Years of education: Not on file   Highest education level: Not on file  Occupational History   Not on file  Tobacco Use   Smoking status: Never   Smokeless tobacco: Never  Vaping Use   Vaping Use: Never used  Substance and Sexual Activity   Alcohol use: Yes    Alcohol/week: 4.0 standard drinks    Types: 4 Standard drinks or equivalent per week    Comment: socially   Drug use: No   Sexual activity: Yes    Birth control/protection: Post-menopausal  Other Topics Concern   Not on file  Social  History Narrative   Not on file   Social Determinants of Health   Financial Resource Strain: Not on file  Food Insecurity: Not on file  Transportation Needs: Not on file  Physical Activity: Not on file  Stress: Not on file  Social Connections: Not on file    Family History  Problem Relation Age of Onset   Coronary artery disease Father    Hypertension Father    Prostate cancer Father    Skin cancer Father    Heart disease Father    Breast cancer Mother    Leukemia Mother    Breast cancer Maternal Aunt    Diabetes Daughter    Colon cancer Neg Hx    Esophageal cancer Neg Hx    Rectal cancer Neg Hx    Stomach cancer Neg Hx    Colon polyps Neg Hx     Review of Systems  Constitutional:  Negative for chills and fever.  HENT:  Positive for congestion (clear mucus) and sinus pain. Negative for ear pain, postnasal drip and sore throat.   Respiratory:  Positive for cough (some mild yellow mucus) and wheezing (tiny). Negative for chest tightness and shortness of breath.   Cardiovascular:  Positive for chest pain.  Gastrointestinal:  Negative for diarrhea and nausea.  Musculoskeletal:  Positive for arthralgias (right knee). Negative for joint swelling and myalgias.  Neurological:  Negative for dizziness and headaches.      Objective:   Vitals:   12/20/20 0811  BP: 124/76  Pulse: 66  Temp: 98.4 F (36.9 C)  SpO2: 98%   BP Readings from Last 3 Encounters:  12/20/20 124/76  09/18/20 106/61  07/31/20 (!) 152/73   Wt Readings from Last 3 Encounters:  12/20/20 170 lb (77.1 kg)  09/18/20 165 lb (74.8 kg)  08/24/20 165 lb (74.8 kg)   Body mass index is 26.63 kg/m.   Physical Exam    GENERAL APPEARANCE: Appears stated age, well appearing, NAD EYES: conjunctiva clear, no icterus HENT: bilateral tympanic membranes and ear canals normal, oropharynx with no erythema or exudates, trachea midline, no cervical or supraclavicular lymphadenopathy LUNGS: Unlabored breathing,  good air entry bilaterally, clear to auscultation without wheeze or crackles CARDIOVASCULAR: Normal S1,S2 , no edema MSK: right knee with mild effusion, no warmth or redness SKIN: Warm, dry      Assessment & Plan:    See Problem List for Assessment and Plan of chronic medical  problems.    This visit occurred during the SARS-CoV-2 public health emergency.  Safety protocols were in place, including screening questions prior to the visit, additional usage of staff PPE, and extensive cleaning of exam room while observing appropriate contact time as indicated for disinfecting solutions.

## 2020-12-20 ENCOUNTER — Encounter: Payer: Self-pay | Admitting: Internal Medicine

## 2020-12-20 ENCOUNTER — Ambulatory Visit (INDEPENDENT_AMBULATORY_CARE_PROVIDER_SITE_OTHER): Payer: PRIVATE HEALTH INSURANCE

## 2020-12-20 ENCOUNTER — Ambulatory Visit: Payer: PRIVATE HEALTH INSURANCE | Admitting: Internal Medicine

## 2020-12-20 ENCOUNTER — Other Ambulatory Visit: Payer: Self-pay

## 2020-12-20 VITALS — BP 124/76 | HR 66 | Temp 98.4°F | Ht 67.0 in | Wt 170.0 lb

## 2020-12-20 DIAGNOSIS — J069 Acute upper respiratory infection, unspecified: Secondary | ICD-10-CM | POA: Diagnosis not present

## 2020-12-20 DIAGNOSIS — M25561 Pain in right knee: Secondary | ICD-10-CM | POA: Insufficient documentation

## 2020-12-20 DIAGNOSIS — I7300 Raynaud's syndrome without gangrene: Secondary | ICD-10-CM | POA: Insufficient documentation

## 2020-12-20 MED ORDER — SULFAMETHOXAZOLE-TRIMETHOPRIM 800-160 MG PO TABS: 1.0000 | ORAL_TABLET | Freq: Two times a day (BID) | ORAL | 0 refills | Status: DC

## 2020-12-20 NOTE — Patient Instructions (Signed)
  Chest xray and knee xray was ordered.     Medications changes include :   Bactrim   Your prescription(s) have been submitted to your pharmacy. Please take as directed and contact our office if you believe you are having problem(s) with the medication(s).

## 2020-12-20 NOTE — Assessment & Plan Note (Addendum)
Acute right knee pain She has had some mild, intermittent right knee pain in the past, but nothing like this It did start with her URI symptoms so she was concerned about the possibility of recurrent Wegener's Likely flare of osteoarthritis X-ray of right knee today Advise continuing to ice, can take NSAIDs, Tylenol If no improvement will see Dr. Amil Amen

## 2020-12-20 NOTE — Assessment & Plan Note (Signed)
Acute Symptoms consistent with upper respiratory infection-less likely Wegener's, but given her history if there is no improvement she will call her rheumatologist-Dr. Amil Amen Chest x-ray today Start Bactrim DS 800-160 mg twice daily x10 days Discussed symptomatic treatment with OTC cold medications Rest, fluids Call if no improvement or if symptoms change/worsen

## 2020-12-27 ENCOUNTER — Encounter: Payer: Self-pay | Admitting: Internal Medicine

## 2021-03-01 ENCOUNTER — Encounter: Payer: Self-pay | Admitting: Internal Medicine

## 2021-03-04 ENCOUNTER — Encounter: Payer: Self-pay | Admitting: Internal Medicine

## 2021-04-16 ENCOUNTER — Ambulatory Visit (AMBULATORY_SURGERY_CENTER): Payer: PRIVATE HEALTH INSURANCE | Admitting: *Deleted

## 2021-04-16 ENCOUNTER — Other Ambulatory Visit: Payer: Self-pay

## 2021-04-16 VITALS — Ht 67.0 in | Wt 168.0 lb

## 2021-04-16 DIAGNOSIS — R1310 Dysphagia, unspecified: Secondary | ICD-10-CM

## 2021-04-16 DIAGNOSIS — K222 Esophageal obstruction: Secondary | ICD-10-CM

## 2021-04-16 NOTE — Progress Notes (Signed)
No egg or soy allergy known to patient  No issues known to pt with past sedation with any surgeries or procedures Patient denies ever being told they had issues or difficulty with intubation  No FH of Malignant Hyperthermia Pt is not on diet pills Pt is not on  home 02  Pt is not on blood thinners  Pt denies issues with constipation  No A fib or A flutter  Pt is fully vaccinated  for Covid   Due to the COVID-19 pandemic we are asking patients to follow certain guidelines in PV and the [facility]   Pt aware of COVID protocols and [facility] guidelines   Pt verified name, DOB, address and insurance during PV today.  Pt mailed instruction packet of Emmi video, copy of consent form to read and not return, and instructions PV completed over the phone.  Pt encouraged to call with questions or issues.  My Chart instructions to pt as well

## 2021-04-19 ENCOUNTER — Telehealth: Payer: Self-pay | Admitting: Internal Medicine

## 2021-04-19 NOTE — Telephone Encounter (Signed)
Patient called stating she had a possible uti  Patient requested an order for labs  Informed the patient she would need an appt  Offered the patient an appt, patient declined

## 2021-04-29 ENCOUNTER — Encounter: Payer: Self-pay | Admitting: Internal Medicine

## 2021-05-01 ENCOUNTER — Encounter: Payer: Self-pay | Admitting: Internal Medicine

## 2021-05-01 ENCOUNTER — Ambulatory Visit (AMBULATORY_SURGERY_CENTER): Payer: Commercial Managed Care - PPO | Admitting: Internal Medicine

## 2021-05-01 VITALS — BP 134/94 | HR 57 | Temp 97.1°F | Resp 14 | Ht 67.0 in | Wt 168.0 lb

## 2021-05-01 DIAGNOSIS — R1310 Dysphagia, unspecified: Secondary | ICD-10-CM

## 2021-05-01 DIAGNOSIS — K219 Gastro-esophageal reflux disease without esophagitis: Secondary | ICD-10-CM

## 2021-05-01 DIAGNOSIS — K222 Esophageal obstruction: Secondary | ICD-10-CM | POA: Diagnosis not present

## 2021-05-01 HISTORY — PX: ESOPHAGOGASTRODUODENOSCOPY (EGD) WITH ESOPHAGEAL DILATION: SHX5812

## 2021-05-01 MED ORDER — SODIUM CHLORIDE 0.9 % IV SOLN: 500.0000 mL | Freq: Once | INTRAVENOUS | Status: DC

## 2021-05-01 NOTE — Patient Instructions (Addendum)
Please read handouts provided. Follow Dilation Diet. Continue present medications, including omeprazole 40 mg twice daily. Contact Dr. Henrene Pastor if having any difficulties with swallowing.   YOU HAD AN ENDOSCOPIC PROCEDURE TODAY AT [facility]:   Refer to the procedure report that was given to you for any specific questions about what was found during the examination.  If the procedure report does not answer your questions, please call your gastroenterologist to clarify.  If you requested that your care partner not be given the details of your procedure findings, then the procedure report has been included in a sealed envelope for you to review at your convenience later.  YOU SHOULD EXPECT: Some feelings of bloating in the abdomen. Passage of more gas than usual.  Walking can help get rid of the air that was put into your GI tract during the procedure and reduce the bloating. If you had a lower endoscopy (such as a colonoscopy or flexible sigmoidoscopy) you may notice spotting of blood in your stool or on the toilet paper. If you underwent a bowel prep for your procedure, you may not have a normal bowel movement for a few days.  Please Note:  You might notice some irritation and congestion in your nose or some drainage.  This is from the oxygen used during your procedure.  There is no need for concern and it should clear up in a day or so.  SYMPTOMS TO REPORT IMMEDIATELY:    Following upper endoscopy (EGD)  Vomiting of blood or coffee ground material  New chest pain or pain under the shoulder blades  Painful or persistently difficult swallowing  New shortness of breath  Fever of 100F or higher  Black, tarry-looking stools  For urgent or emergent issues, a gastroenterologist can be reached at any hour by calling [phone]. Do not use MyChart messaging for urgent concerns.    DIET:   Drink plenty of fluids but you should avoid alcoholic beverages for 24  hours.  ACTIVITY:  You should plan to take it easy for the rest of today and you should NOT DRIVE or use heavy machinery until tomorrow (because of the sedation medicines used during the test).    FOLLOW UP: Our staff will call the number listed on your records 48-72 hours following your procedure to check on you and address any questions or concerns that you may have regarding the information given to you following your procedure. If we do not reach you, we will leave a message.  We will attempt to reach you two times.  During this call, we will ask if you have developed any symptoms of COVID 19. If you develop any symptoms (ie: fever, flu-like symptoms, shortness of breath, cough etc.) before then, please call [phone].  If you test positive for Covid 19 in the 2 weeks post procedure, please call and report this information to [place].    If any biopsies were taken you will be contacted by phone or by letter within the next 1-3 weeks.  Please call us at [phone] if you have not heard about the biopsies in 3 weeks.    SIGNATURES/CONFIDENTIALITY: You and/or your care partner have signed paperwork which will be entered into your electronic medical record.  These signatures attest to the fact that that the information above on your After Visit Summary has been reviewed and is understood.  Full responsibility of the confidentiality of this discharge information lies with you and/or your care-partner.

## 2021-05-01 NOTE — Progress Notes (Signed)
Pt's states no medical or surgical changes since previsit or office visit.   DT vitals. 

## 2021-05-01 NOTE — Progress Notes (Signed)
Report to PACU, RN, vss, BBS= Clear.  

## 2021-05-01 NOTE — Progress Notes (Signed)
Called to room to assist during endoscopic procedure.  Patient ID and intended procedure confirmed with present staff. Received instructions for my participation in the procedure from the performing physician.  

## 2021-05-01 NOTE — Progress Notes (Signed)
HISTORY OF PRESENT ILLNESS:  Molly Erickson is a 70 y.o. female with GERD complicated by peptic stricture.  She presents today for EGD with esophageal dilation for recurrent dysphagia.  She is maintained on PPI twice daily for her GERD.  Last dilation March 2022, maximal diameter 20 mm balloon  REVIEW OF SYSTEMS:  All non-GI ROS negative. 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[place])    Hashimoto's thyroiditis    on 100 mcg synthroid   Hematuria    Hyperlipidemia    Hypothyroidism    Neck pain 12/31/2014   Osteopenia    Osteoporosis    Relapsing polychondritis    Thyroid activity decreased    Wegener's syndrome     Past Surgical History:  Procedure Laterality Date   BREAST BIOPSY     Papilloma-Intraductal   BREAST LUMPECTOMY     benign   COLONOSCOPY     COLPOSCOPY     ESOPHAGOGASTRODUODENOSCOPY (EGD) WITH ESOPHAGEAL DILATION     LUNG BIOPSY     UPPER GASTROINTESTINAL ENDOSCOPY  07/2020   JP-MAC-moderate stenosis/esophagitis   WISDOM TOOTH EXTRACTION      Social History Molly Erickson  reports that she has never smoked. She has never used smokeless tobacco. She reports current alcohol use of about 4.0 standard drinks per week. She reports that she does not use drugs.  family history includes Breast cancer in her maternal aunt and mother; Coronary artery disease in her father; Diabetes in her daughter; Heart disease in her father; Hypertension in her father; Leukemia in her mother; Prostate cancer in her father; Skin cancer in her father.  No Known Allergies     PHYSICAL EXAMINATION:  Vital signs: BP (!) 141/58    Pulse (!) 58   Temp (!) 97.1 F (36.2 C) (Temporal)   Ht _0  (1.702 m)   Wt 168 lb (76.2 kg)   SpO2 99%   BMI 26.31 kg/m  General: Well-developed, well-nourished, no acute distress HEENT: Sclerae are anicteric, conjunctiva pink. Oral mucosa intact Lungs: Clear Heart: Regular Abdomen: soft, nontender, nondistended, no obvious ascites, no peritoneal signs, normal bowel sounds. No organomegaly. Extremities: No edema Psychiatric: alert and oriented x3. Cooperative     ASSESSMENT:  1.  GERD complicated by peptic stricture.  Symptomatic dysphagia   PLAN:  1.  EGD with esophageal dilation

## 2021-05-01 NOTE — Op Note (Signed)
[facility] Patient Name: Molly Erickson Procedure Date: 05/01/2021 10:02 AM MRN: [id number] Endoscopist: Docia Chuck. Henrene Pastor , MD Age: 70 Referring MD:  Date of Birth: Apr 05, 1951 Gender: Female Account #: [id number] Procedure:                Upper GI endoscopy with balloon dilation of the                            esophagus. 18-20 mm balloon Indications:              Therapeutic procedure, Dysphagia, Esophageal                            reflux. Last dilation March 2022 Medicines:                Monitored Anesthesia Care Procedure:                Pre-Anesthesia Assessment:                           - Prior to the procedure, a History and Physical                            was performed, and patient medications and                            allergies were reviewed. The patient's tolerance of                            previous anesthesia was also reviewed. The risks                            and benefits of the procedure and the sedation                            options and risks were discussed with the patient.                            All questions were answered, and informed consent                            was obtained. Prior Anticoagulants: The patient has                            taken no previous anticoagulant or antiplatelet                            agents. ASA Grade Assessment: II - A patient with                            mild systemic disease. After reviewing the risks                            and benefits, the patient was deemed in  satisfactory condition to undergo the procedure.                           After obtaining informed consent, the endoscope was                            passed under direct vision. Throughout the                            procedure, the patient's blood pressure, pulse, and                            oxygen saturations were monitored continuously. The                            #4314 Olympus  Endoscope was introduced through the                            mouth, and advanced to the second part of duodenum.                            The upper GI endoscopy was accomplished without                            difficulty. The patient tolerated the procedure                            well. Scope In: Scope Out: Findings:                 The esophagus was slightly tortuous. There was a                            ringlike stricture at the gastroesophageal junction                            (35 cm) measuring approximately 15 mm. A TTS                            dilator was passed through the scope. Dilation with                            an 18-19-20 mm balloon dilator was performed to 20                            mm. The stricture was disrupted.                           The stomach was normal, save a moderate hiatal                            hernia.                           The examined duodenum was normal.  The cardia and gastric fundus were normal on                            retroflexion. Complications:            No immediate complications. Estimated Blood Loss:     Estimated blood loss: none. Impression:               1. GERD complicated by peptic stricture status post                            balloon dilation to a maximal diameter of 20 mm                           2. Hiatal hernia. Otherwise normal EGD. Recommendation:           1. Patient has a contact number available for                            emergencies. The signs and symptoms of potential                            delayed complications were discussed with the                            patient. Return to normal activities tomorrow.                            Written discharge instructions were provided to the                            patient.                           2. Post dilation diet.                           3. Continue present medications. Currently on                             omeprazole 40 mg twice daily for GERD                           4. Please contact Dr. Henrene Pastor for any persistent or                            recurrent difficulties with swallowing Docia Chuck. Henrene Pastor, MD 05/01/2021 10:20:46 AM This report has been signed electronically.

## 2021-05-03 ENCOUNTER — Telehealth: Payer: Self-pay | Admitting: *Deleted

## 2021-05-03 NOTE — Telephone Encounter (Signed)
  Follow up Call-  Call back number 05/01/2021 09/18/2020 07/31/2020 03/08/2019  Post procedure Call Back phone  # [phone]  Permission to leave phone message Yes Yes Yes Yes  Some recent data might be hidden     Patient questions:  Do you have a fever, pain , or abdominal swelling? No. Pain Score  0 *  Have you tolerated food without any problems? Yes.    Have you been able to return to your normal activities? Yes.    Do you have any questions about your discharge instructions: Diet   No. Medications  No. Follow up visit  No.  Do you have questions or concerns about your Care? No.  Actions: * If pain score is 4 or above: No action needed, pain <4.  Have you developed a fever since your procedure? no  2.   Have you had an respiratory symptoms (SOB or cough) since your procedure? no  3.   Have you tested positive for COVID 19 since your procedure no  4.   Have you had any family members/close contacts diagnosed with the COVID 19 since your procedure?  no   If yes to any of these questions please route to Joylene John, RN and Joella Prince, RN

## 2021-06-23 HISTORY — PX: UPPER GASTROINTESTINAL ENDOSCOPY: SHX188

## 2021-08-26 ENCOUNTER — Other Ambulatory Visit: Payer: Self-pay | Admitting: Internal Medicine

## 2021-09-06 ENCOUNTER — Telehealth: Payer: Self-pay

## 2021-09-06 NOTE — Telephone Encounter (Signed)
Pt scheduled for previsit 09/26/21 at 1:30pm. EGD with dil scheduled in the [facility] 10/08/21 at 4pm. She is aware of appts and will call back and let us know if the 18th will work for her. ?

## 2021-09-06 NOTE — Telephone Encounter (Signed)
-----   Message from Irene Shipper, MD sent at 09/06/2021 12:40 PM EDT ----- ?Regarding: Needs EGD in the [place] ?Vaughan Basta, ?Mr. Morr (friend) reached out to me regarding his wife Molly Erickson). ?She is having recurrent dysphagia. ?Please set her up for EGD with dilation in the [place] with me.  You can reach her on her mobile phone. ?Thanks ?Dr. Henrene Pastor ? ?

## 2021-09-26 ENCOUNTER — Ambulatory Visit (AMBULATORY_SURGERY_CENTER): Payer: Commercial Managed Care - PPO | Admitting: *Deleted

## 2021-09-26 VITALS — Ht 67.0 in | Wt 160.0 lb

## 2021-09-26 DIAGNOSIS — R1310 Dysphagia, unspecified: Secondary | ICD-10-CM

## 2021-09-26 NOTE — Progress Notes (Signed)
Patient's pre-visit was done today over the phone with the patient. Name,DOB and address verified. Patient denies any allergies to Eggs and Soy. Patient denies any problems with anesthesia/sedation. Patient is not taking any diet pills or blood thinners. No home Oxygen.  ?Prep instructions sent to pt's MyChart -pt is aware. Patient understands to call us back with any questions or concerns. Patient is aware of our care-partner policy.  ? ? ?The patient is COVID-19 vaccinated.   ?

## 2021-10-08 ENCOUNTER — Encounter: Payer: Commercial Managed Care - PPO | Admitting: Internal Medicine

## 2021-10-14 ENCOUNTER — Ambulatory Visit: Payer: Commercial Managed Care - PPO | Admitting: Internal Medicine

## 2021-10-14 ENCOUNTER — Encounter: Payer: Self-pay | Admitting: Internal Medicine

## 2021-10-14 ENCOUNTER — Telehealth: Payer: Self-pay

## 2021-10-14 DIAGNOSIS — J069 Acute upper respiratory infection, unspecified: Secondary | ICD-10-CM

## 2021-10-14 MED ORDER — BENZONATATE 200 MG PO CAPS: 200.0000 mg | ORAL_CAPSULE | Freq: Three times a day (TID) | ORAL | 0 refills | Status: DC | PRN

## 2021-10-14 MED ORDER — PREDNISONE 20 MG PO TABS: 40.0000 mg | ORAL_TABLET | Freq: Every day | ORAL | 0 refills | Status: DC

## 2021-10-14 MED ORDER — PREDNISONE 20 MG PO TABS: 40.0000 mg | ORAL_TABLET | Freq: Every day | ORAL | 0 refills | Status: AC

## 2021-10-14 NOTE — Telephone Encounter (Signed)
Pt husband is calling to make pt an appt. I advise him the first appt is tomorrow 4/25@ 750. Pt is experiencing Chest congestion, coughing and her husband states that she needs prednisone and something for cough.  ? ?Pt husband first says he isn't sure she will be able to make that but then is asking to make the appt and requesting a call back at [phone] ? ?Please advise ?

## 2021-10-14 NOTE — Assessment & Plan Note (Signed)
Acute ?Symptoms likely viral in nature ?End of last week went to urgent care and rapid strep, flu and COVID test all negative ?Finishing a Z-Pak-did not seem to help much ?We will try prednisone 40 mg daily x5 days ?Tessalon Perles 100 mg 3 times daily as needed ?Deferred prescription cough syrup ?Can take over-the-counter cough suppressants ?Continue symptomatic treatment with over-the-counter cold medications, Tylenol/ibuprofen ?Increase rest and fluids ?Call if symptoms worsen or do not improve ? ?

## 2021-10-14 NOTE — Telephone Encounter (Signed)
Patient being seen this afternoon. ?

## 2021-10-14 NOTE — Progress Notes (Signed)
? ? ?Subjective:  ? ? Patient ID: Molly Erickson, female    DOB: February 16, 1951, 71 y.o.   MRN: [id number] ? ?This visit occurred during the SARS-CoV-2 public health emergency.  Safety protocols were in place, including screening questions prior to the visit, additional usage of staff PPE, and extensive cleaning of exam room while observing appropriate contact time as indicated for disinfecting solutions. ? ? ? ?HPI ?Molly Erickson is here for  ?Chief Complaint  ?Patient presents with  ? Cough  ?  Cough, congestion, mucus-brownish tent  ? ? ?She is here for an acute visit for cold symptoms.  ? ?Her symptoms started one week ago.  She went to urgent care 4/20.  Her tests for COVID, influenza and strep test were all negative. ? ?She is experiencing fever of 100.5 today, chills, decreased appetite, postnasal drainage, sore throat, Cough that is productive at times, some mild shortness of breath and headaches. ? ?She has tried taking  zpak-has 1 pill left today.  She has not seen much difference with the antibiotic. ? ?She is concerned because she has autoimmune disease and it often takes her longer to get over colds. ? ? ? ? ?Medications and allergies reviewed with patient and updated if appropriate. ? ?Current Outpatient Medications on File Prior to Visit  ?Medication Sig Dispense Refill  ? azithromycin (ZITHROMAX) 250 MG tablet Take by mouth.    ? Cholecalciferol (VITAMIN D3) 125 MCG (5000 UT) CAPS Take by mouth daily.    ? Cyanocobalamin (VITAMIN B 12 PO) Take by mouth.    ? Famotidine (PEPCID PO) Take by mouth daily.    ? liothyronine (CYTOMEL) 5 MCG tablet Take 45 mcg by mouth daily. Patient takes '5mg'$  TID    ? omeprazole (PRILOSEC) 40 MG capsule TAKE ONE CAPSULE TWICE A DAY AT LEAST 30MINUTES BEFORE MEALS 180 capsule 3  ? raloxifene (EVISTA) 60 MG tablet     ? SYNTHROID 100 MCG tablet Take 100 mcg by mouth every morning.    ? acyclovir (ZOVIRAX) 200 MG capsule Take 1 capsule (200 mg total) by mouth 5 (five) times daily.  (Patient not taking: Reported on 04/16/2021) 25 capsule 0  ? ?No current facility-administered medications on file prior to visit.  ? ? ?Review of Systems  ?Constitutional:  Positive for appetite change (dec), chills and fever.  ?HENT:  Positive for postnasal drip, sore throat and voice change. Negative for congestion, sinus pressure and sinus pain.   ?Respiratory:  Positive for cough (productive) and shortness of breath. Negative for wheezing.   ?Neurological:  Positive for headaches (initially). Negative for dizziness.  ? ?   ?Objective:  ? ?Vitals:  ? 10/14/21 1514  ?BP: 114/72  ?Pulse: 83  ?Temp: (!) 100.5 ?F (38.1 ?C)  ?SpO2: 97%  ? ?BP Readings from Last 3 Encounters:  ?10/14/21 114/72  ?05/01/21 (!) 134/94  ?12/20/20 124/76  ? ?Wt Readings from Last 3 Encounters:  ?10/14/21 164 lb (74.4 kg)  ?09/26/21 160 lb (72.6 kg)  ?05/01/21 168 lb (76.2 kg)  ? ?Body mass index is 25.69 kg/m?. ? ?  ?Physical Exam ?Constitutional:   ?   General: She is not in acute distress. ?   Appearance: Normal appearance. She is not ill-appearing.  ?HENT:  ?   Head: Normocephalic and atraumatic.  ?   Right Ear: Tympanic membrane, ear canal and external ear normal.  ?   Left Ear: Tympanic membrane, ear canal and external ear normal.  ?   Mouth/Throat:  ?  Mouth: Mucous membranes are moist.  ?   Pharynx: No oropharyngeal exudate or posterior oropharyngeal erythema.  ?Eyes:  ?   Conjunctiva/sclera: Conjunctivae normal.  ?Cardiovascular:  ?   Rate and Rhythm: Normal rate and regular rhythm.  ?Pulmonary:  ?   Effort: Pulmonary effort is normal. No respiratory distress.  ?   Breath sounds: Normal breath sounds. No wheezing or rales.  ?Musculoskeletal:  ?   Cervical back: Neck supple. No tenderness.  ?Lymphadenopathy:  ?   Cervical: No cervical adenopathy.  ?Skin: ?   General: Skin is warm and dry.  ?Neurological:  ?   Mental Status: She is alert.  ? ?   ? ? ? ? ? ?Assessment & Plan:  ? ? ?See Problem List for Assessment and Plan of chronic  medical problems.  ? ? ? ? ?

## 2021-10-14 NOTE — Patient Instructions (Addendum)
? ? ? ? ?  Medications changes include :   prednisone 40 mg daily x 5 days, tessalon perles  ? ? ?Your prescription(s) have been sent to your pharmacy.  ? ? ? ?Return if symptoms worsen or fail to improve. ? ?

## 2021-10-15 ENCOUNTER — Encounter: Payer: Self-pay | Admitting: Internal Medicine

## 2021-10-16 ENCOUNTER — Telehealth: Payer: Self-pay | Admitting: Internal Medicine

## 2021-10-16 ENCOUNTER — Other Ambulatory Visit: Payer: Self-pay

## 2021-10-16 MED ORDER — HYDROCOD POLI-CHLORPHE POLI ER 10-8 MG/5ML PO SUER: 5.0000 mL | Freq: Two times a day (BID) | ORAL | 0 refills | Status: DC | PRN

## 2021-10-16 NOTE — Telephone Encounter (Signed)
Left message that script had been sent to pharmacy requested.  ?

## 2021-10-16 NOTE — Telephone Encounter (Signed)
Error

## 2021-10-16 NOTE — Telephone Encounter (Signed)
Prescription sent to pharmacy.

## 2021-10-16 NOTE — Telephone Encounter (Signed)
Pts spouse states pt was offered a cough syrup w/ codeine by provider at yesterday's ov ? ?Spouse states pt needs is and  inquiring if rx can be sent in ? ?Pharmacy: [facility], [place], [place] ? ? ?

## 2021-10-17 ENCOUNTER — Encounter: Payer: Commercial Managed Care - PPO | Admitting: Internal Medicine

## 2021-10-24 ENCOUNTER — Encounter: Payer: Self-pay | Admitting: Internal Medicine

## 2021-10-24 ENCOUNTER — Ambulatory Visit (AMBULATORY_SURGERY_CENTER): Payer: Commercial Managed Care - PPO | Admitting: Internal Medicine

## 2021-10-24 VITALS — BP 124/83 | HR 60 | Temp 98.0°F | Resp 16 | Ht 67.0 in | Wt 160.0 lb

## 2021-10-24 DIAGNOSIS — K222 Esophageal obstruction: Secondary | ICD-10-CM | POA: Diagnosis not present

## 2021-10-24 DIAGNOSIS — K219 Gastro-esophageal reflux disease without esophagitis: Secondary | ICD-10-CM | POA: Diagnosis not present

## 2021-10-24 DIAGNOSIS — K449 Diaphragmatic hernia without obstruction or gangrene: Secondary | ICD-10-CM | POA: Diagnosis not present

## 2021-10-24 DIAGNOSIS — R1310 Dysphagia, unspecified: Secondary | ICD-10-CM | POA: Diagnosis present

## 2021-10-24 MED ORDER — SODIUM CHLORIDE 0.9 % IV SOLN: 500.0000 mL | Freq: Once | INTRAVENOUS | Status: DC

## 2021-10-24 NOTE — Patient Instructions (Addendum)
Esophageal Dilatation ?Esophageal dilatation, also called esophageal dilation, is a procedure to widen or open a blocked or narrowed part of the esophagus. The esophagus is the part of the body that moves food and liquid from the mouth to the stomach. You may need this procedure if: ?You have a buildup of scar tissue in your esophagus that makes it difficult, painful, or impossible to swallow. This can be caused by gastroesophageal reflux disease (GERD). ?You have cancer of the esophagus. ?There is a problem with how food moves through your esophagus. ?In some cases, you may need this procedure repeated at a later time to dilate the esophagus gradually. ?Take over-the-counter and prescription medicines only as told by your health care provider. ?If you were given a sedative during the procedure, it can affect you for several hours. Do not drive or operate machinery until your health care provider says that it is safe. ?Plan to have a responsible adult care for you for the time you are told. This is important. ?Follow instructions from your health care provider about any eating or drinking restrictions. ?Do not use any products that contain nicotine or tobacco, such as cigarettes, e-cigarettes, and chewing tobacco. If you need help quitting, ask your health care provider. ?Keep all follow-up visits. This is important. ?Contact a health care provider if: ?You have a fever. ?You have pain that is not relieved by medicine. ?Get help right away if: ?You have chest pain. ?You have trouble breathing. ?You have trouble swallowing. ?You vomit blood. ?You have black, tarry, or bloody stools. ?These symptoms may represent a serious problem that is an emergency. Do not wait to see if the symptoms will go away. Get medical help right away. Call your local emergency services (911 in the [place] Do not drive yourself to the hospital. ?Summary ?Esophageal dilatation, also called esophageal dilation, is a procedure to widen or open a  blocked or narrowed part of the esophagus. ?Plan to have a responsible adult take you home from the hospital or clinic. ?For this procedure, a numbing medicine may be sprayed into the back of your throat, or you may gargle the medicine. ?Do not drive or operate machinery until your health care provider says that it is safe. ?This information is not intended to replace advice given to you by your health care provider. Make sure you discuss any questions you have with your health care provider. ?Document Revised: 10/26/2019 Document Reviewed: 10/26/2019 ?Elsevier Patient Education ? [place]. ? ? ? ? ? ? ? ?YOU HAD AN ENDOSCOPIC PROCEDURE TODAY: Refer to the procedure report and other information in the discharge instructions given to you for any specific questions about what was found during the examination. If this information does not answer your questions, please call [place] office at [phone] to clarify.  ? ?YOU SHOULD EXPECT: Some feelings of bloating in the abdomen. Passage of more gas than usual. Walking can help get rid of the air that was put into your GI tract during the procedure and reduce the bloating. If you had a lower endoscopy (such as a colonoscopy or flexible sigmoidoscopy) you may notice spotting of blood in your stool or on the toilet paper. Some abdominal soreness may be present for a day or two, also. ? ?DIET: Your first meal following the procedure should be a light meal and then it is ok to progress to your normal diet. A half-sandwich or bowl of soup is an example of a good first meal. Heavy or  fried foods are harder to digest and may make you feel nauseous or bloated. Drink plenty of fluids but you should avoid alcoholic beverages for 24 hours. If you had a esophageal dilation, please see attached instructions for diet.   ? ?ACTIVITY: Your care partner should take you home directly after the procedure. You should plan to take it easy, moving slowly for the rest of the day. You  can resume normal activity the day after the procedure however YOU SHOULD NOT DRIVE, use power tools, machinery or perform tasks that involve climbing or major physical exertion for 24 hours (because of the sedation medicines used during the test).  ? ?SYMPTOMS TO REPORT IMMEDIATELY: ?A gastroenterologist can be reached at any hour. Please call [phone]  for any of the following symptoms:  ? ?Following upper endoscopy (EGD, EUS, ERCP, esophageal dilation) ?Vomiting of blood or coffee ground material  ?New, significant abdominal pain  ?New, significant chest pain or pain under the shoulder blades  ?Painful or persistently difficult swallowing  ?New shortness of breath  ?Black, tarry-looking or red, bloody stools ? ?FOLLOW UP:  ?If any biopsies were taken you will be contacted by phone or by letter within the next 1-3 weeks. Call [phone]  if you have not heard about the biopsies in 3 weeks.  ?Please also call with any specific questions about appointments or follow up tests.  ?

## 2021-10-24 NOTE — Progress Notes (Signed)
Called to room to assist during endoscopic procedure.  Patient ID and intended procedure confirmed with present staff. Received instructions for my participation in the procedure from the performing physician.  

## 2021-10-24 NOTE — Progress Notes (Signed)
Suctioned during procedure, coughing.  To pacu awake ?

## 2021-10-24 NOTE — Op Note (Signed)
[place] ?Patient Name: Molly Erickson ?Procedure Date: 10/24/2021 1:23 PM ?MRN: [id number] ?Endoscopist: Docia Chuck. Henrene Pastor , MD ?Age: 71 ?Referring MD:  ?Date of Birth: 1951-01-01 ?Gender: Female ?Account #: [id number] ?Procedure:                Upper GI endoscopy with balloon dilation of the  ?                          esophagus. 20 mm max ?Indications:              Therapeutic procedure, Dysphagia, Esophageal reflux ?Medicines:                Monitored Anesthesia Care ?Procedure:                Pre-Anesthesia Assessment: ?                          - Prior to the procedure, a History and Physical  ?                          was performed, and patient medications and  ?                          allergies were reviewed. The patient's tolerance of  ?                          previous anesthesia was also reviewed. The risks  ?                          and benefits of the procedure and the sedation  ?                          options and risks were discussed with the patient.  ?                          All questions were answered, and informed consent  ?                          was obtained. Prior Anticoagulants: The patient has  ?                          taken no previous anticoagulant or antiplatelet  ?                          agents. ASA Grade Assessment: II - A patient with  ?                          mild systemic disease. After reviewing the risks  ?                          and benefits, the patient was deemed in  ?                          satisfactory condition to undergo the procedure. ?  After obtaining informed consent, the endoscope was  ?                          passed under direct vision. Throughout the  ?                          procedure, the patient's blood pressure, pulse, and  ?                          oxygen saturations were monitored continuously. The  ?                          Endoscope was introduced through the mouth, and  ?                           advanced to the second part of duodenum. The upper  ?                          GI endoscopy was accomplished without difficulty.  ?                          The patient tolerated the procedure well. ?Scope In: ?Scope Out: ?Findings:                 The esophagus was was tortuous. There was a 15 mm  ?                          fibrous stricture at the gastroesophageal junction  ?                          (30 cm). A TTS dilator was passed through the  ?                          scope. Dilation with an 18-19-20 mm balloon dilator  ?                          was performed to 20 mm. There was mucosal  ?                          disruption of the stricture. ?                          The stomach revealed a moderate to large sliding  ?                          hiatal hernia but was otherwise normal. ?                          The examined duodenum was normal. ?                          The cardia and gastric fundus were normal on  ?  retroflexion, save hiatal hernia. ?Complications:            No immediate complications. ?Estimated Blood Loss:     Estimated blood loss: none. ?Impression:               1. GERD complicated by peptic stricture status post  ?                          esophageal dilation ?                          2. EGD with moderate to large hiatal hernia but  ?                          otherwise normal.. ?Recommendation:           1. Patient has a contact number available for  ?                          emergencies. The signs and symptoms of potential  ?                          delayed complications were discussed with the  ?                          patient. Return to normal activities tomorrow.  ?                          Written discharge instructions were provided to the  ?                          patient. ?                          2. Post dilation diet. ?                          3. Continue present medications. ?                          4. Confirm that we are taking  omeprazole 40 mg  ?                          TWICE daily ?                          5. Please contact Dr. Henrene Pastor for persistent or  ?                          worsening swallowing difficulties ?Docia Chuck. Henrene Pastor, MD ?10/24/2021 1:44:42 PM ?This report has been signed electronically. ?

## 2021-10-24 NOTE — Progress Notes (Signed)
Pt's states no medical or surgical changes since previsit or office visit. 

## 2021-10-24 NOTE — Progress Notes (Signed)
HISTORY OF PRESENT ILLNESS: ? ?Molly Erickson is a 71 y.o. female with past medical history as listed below who has been seen frequently for upper endoscopy with esophageal dilation due to GERD complicated by peptic stricture.  She presents now after contact the office with recurrent worsening dysphagia.  She is again for upper endoscopy with esophageal dilation. ? ?REVIEW OF SYSTEMS: ? ?All non-GI ROS negative. ?Past Medical History:  ?Diagnosis Date  ? Abscess of finger 11/15/2010  ? Allergy   ? Arthritis   ? Blood transfusion without reported diagnosis   ? Bronchitis   ? Cancer ([place]) 06/2011  ? Basal cell, Dr Edison Pace , [place]  ? CIN I (cervical intraepithelial neoplasia I)   ? COVID-19 virus infection   ? Fever blister   ? GERD (gastroesophageal reflux disease)   ? 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 Hashimoto's thyroiditis   ? on 100 mcg synthroid  ? Hematuria   ? Hyperlipidemia   ? Hypothyroidism   ? Neck pain 12/31/2014  ? Osteopenia   ? Osteoporosis   ? Relapsing polychondritis   ? Thyroid activity decreased   ? Wegener's syndrome   ? ? ?Past Surgical History:  ?Procedure Laterality Date  ? BREAST BIOPSY    ? Papilloma-Intraductal  ? BREAST LUMPECTOMY    ? benign  ? COLONOSCOPY  2014  ? COLPOSCOPY    ? ESOPHAGOGASTRODUODENOSCOPY (EGD) WITH ESOPHAGEAL DILATION  05/01/2021  ? Dr.Council Munguia  ? LUNG BIOPSY    ? UPPER GASTROINTESTINAL ENDOSCOPY  07/2020  ? JP-MAC-moderate stenosis/esophagitis  ? WISDOM TOOTH EXTRACTION    ? ? ?Social History ?[name]  reports that she has never smoked. She has never used smokeless tobacco. She reports current alcohol use of about 4.0 standard drinks per week. She reports that she does not use drugs. ? ?family history includes Breast cancer in her maternal aunt and mother; Coronary artery disease in her father; Diabetes in her daughter; Heart disease in her father; Hypertension in her father; Leukemia in her mother; Prostate cancer in her father; Skin cancer in her  father. ? ?No Known Allergies ? ?  ? ?PHYSICAL EXAMINATION: ? ?Vital signs: BP 116/60   Pulse 61   Temp 98 ?F (36.7 ?C)   Ht _0  (1.702 m)   Wt 160 lb (72.6 kg)   SpO2 99%   BMI 25.06 kg/m?  ?General: Well-developed, well-nourished, no acute distress ?HEENT: Sclerae are anicteric, conjunctiva pink. Oral mucosa intact ?Lungs: Clear ?Heart: Regular ?Abdomen: soft, nontender, nondistended, no obvious ascites, no peritoneal signs, normal bowel sounds. No organomegaly. ?Extremities: No edema ?Psychiatric: alert and oriented x3. Cooperative  ? ? ? ?ASSESSMENT: ? ?GERD complicated by peptic stricture with recurrent dysphagia ? ? ?PLAN: ? ? ?EGD with esophageal dilation. ? ? ? ?  ?

## 2021-10-28 ENCOUNTER — Telehealth: Payer: Self-pay | Admitting: *Deleted

## 2021-10-28 NOTE — Telephone Encounter (Signed)
?  Follow up Call- ? ? ?  10/24/2021  ? 12:55 PM 05/01/2021  ?  9:32 AM 09/18/2020  ?  9:22 AM 07/31/2020  ?  2:59 PM 03/08/2019  ?  3:16 PM  ?Call back number  ?Post procedure Call Back phone  # [phone]  ?Permission to leave phone message Yes Yes Yes Yes Yes  ?  ? ?Patient questions: ? ?Do you have a fever, pain , or abdominal swelling? No. ?Pain Score  0 * ? ?Have you tolerated food without any problems? Yes.   ? ?Have you been able to return to your normal activities? Yes.   ? ?Do you have any questions about your discharge instructions: ?Diet   No. ?Medications  No. ?Follow up visit  No. ? ?Do you have questions or concerns about your Care? No. ? ?Actions: ?* If pain score is 4 or above: ?No action needed, pain <4. ? ? ?

## 2022-02-20 ENCOUNTER — Telehealth (INDEPENDENT_AMBULATORY_CARE_PROVIDER_SITE_OTHER): Payer: Commercial Managed Care - PPO | Admitting: Nurse Practitioner

## 2022-02-20 VITALS — Temp 96.0°F

## 2022-02-20 DIAGNOSIS — U071 COVID-19: Secondary | ICD-10-CM | POA: Diagnosis not present

## 2022-02-20 MED ORDER — MOLNUPIRAVIR EUA 200MG CAPSULE: 4.0000 | ORAL_CAPSULE | Freq: Two times a day (BID) | ORAL | 0 refills | Status: AC

## 2022-02-20 NOTE — Progress Notes (Signed)
   Established Patient Office Visit  An audio-only tele-health visit was completed today for this patient. I connected with  Molly Erickson on 02/20/22 utilizing audio-only technology and verified that I am speaking with the correct person using two identifiers. The patient was located at their home, and I was located at the office of [place] at [facility] during the encounter. I discussed the limitations of evaluation and management by telemedicine. The patient expressed understanding and agreed to proceed.     Subjective   Patient ID: Molly Erickson, female    DOB: Oct 29, 1950  Age: 72 y.o. MRN: [id number]  Chief Complaint  Patient presents with   Covid Positive    Symptoms started yesterday, she took an at home test today x2 both of which were positive.  She has been vaccinated x4.  She has history of hypothyroidism as well as autoimmune disease.  She is not currently on immunosuppressant.  Currently symptoms mild with fatigue, congestion, ear pain, and sore throat.  No coughing or shortness of breath as of now.    Review of Systems  Constitutional:  Positive for malaise/fatigue. Negative for chills and fever.  HENT:  Positive for congestion, ear pain (discomfort (R)) and sore throat.   Respiratory:  Negative for cough and shortness of breath.   Cardiovascular:  Negative for chest pain.  Gastrointestinal:  Negative for diarrhea, nausea and vomiting.  Musculoskeletal:  Negative for back pain.  Neurological:  Positive for headaches (congestion).      Objective:     Temp (!) 96 F (35.6 C) Comment: temporal   Physical Exam Comprehensive physical exam not completed today as office visit was conducted remotely.  Patient sounds well over the phone, no coughing noted, patient able to speak in complete sentences without having to stop to breathe.  Patient was alert and oriented, and appeared to have appropriate judgment.   No results found for any visits on  02/20/22.    The ASCVD Risk score (Arnett DK, et al., 2019) failed to calculate for the following reasons:   Cannot find a previous HDL lab   Cannot find a previous total cholesterol lab    Assessment & Plan:   Problem List Items Addressed This Visit       Other   COVID-19 - Primary    Acute, symptoms started yesterday.  She is at risk for severe disease based on her comorbidities.  We will treat her with molnupiravir as she is on multiple medications and I do not see an updated GFR in her chart.  She will take 4 capsules by mouth twice a day for 5 days, patient told to watch out for signs of worsening shortness of breath, coughing, confusion, and proceed to the emergency department if symptoms worsen.  She reports understanding.  Patient told to isolate at home for 5 full days and symptoms started, then if she goes out into the community she wear a mask for additional 5 days.  Patient reports understanding.      Relevant Medications   molnupiravir EUA (LAGEVRIO) 200 mg CAPS capsule    Return if symptoms worsen or fail to improve.  Total time spent the telephone was 20 minutes and 25 seconds.   Ailene Ards, NP

## 2022-02-20 NOTE — Assessment & Plan Note (Signed)
Acute, symptoms started yesterday.  She is at risk for severe disease based on her comorbidities.  We will treat her with molnupiravir as she is on multiple medications and I do not see an updated GFR in her chart.  She will take 4 capsules by mouth twice a day for 5 days, patient told to watch out for signs of worsening shortness of breath, coughing, confusion, and proceed to the emergency department if symptoms worsen.  She reports understanding.  Patient told to isolate at home for 5 full days and symptoms started, then if she goes out into the community she wear a mask for additional 5 days.  Patient reports understanding.

## 2022-10-21 ENCOUNTER — Encounter: Payer: Self-pay | Admitting: Internal Medicine

## 2022-10-25 ENCOUNTER — Other Ambulatory Visit: Payer: Self-pay | Admitting: Internal Medicine

## 2023-01-05 ENCOUNTER — Ambulatory Visit: Payer: Commercial Managed Care - PPO | Admitting: Family Medicine

## 2023-01-05 ENCOUNTER — Encounter: Payer: Self-pay | Admitting: Family Medicine

## 2023-01-05 VITALS — BP 118/70 | HR 68 | Temp 97.8°F | Resp 20 | Ht 67.0 in | Wt 167.0 lb

## 2023-01-05 DIAGNOSIS — T3695XA Adverse effect of unspecified systemic antibiotic, initial encounter: Secondary | ICD-10-CM

## 2023-01-05 DIAGNOSIS — B379 Candidiasis, unspecified: Secondary | ICD-10-CM

## 2023-01-05 DIAGNOSIS — R590 Localized enlarged lymph nodes: Secondary | ICD-10-CM | POA: Diagnosis not present

## 2023-01-05 DIAGNOSIS — E038 Other specified hypothyroidism: Secondary | ICD-10-CM

## 2023-01-05 MED ORDER — FLUCONAZOLE 150 MG PO TABS: 150.0000 mg | ORAL_TABLET | Freq: Once | ORAL | 0 refills | Status: AC

## 2023-01-05 NOTE — Patient Instructions (Signed)
Continue Augmentin

## 2023-01-05 NOTE — Progress Notes (Signed)
Assessment & Plan:  1. Cervical lymphadenopathy Encouraged to continue taking Augmentin.  Reassurance provided that her symptoms are improving.  2. Antibiotic-induced yeast infection - fluconazole (DIFLUCAN) 150 MG tablet; Take 1 tablet (150 mg total) by mouth once for 1 dose. May repeat after 3 days if needed.  Dispense: 2 tablet; Refill: 0  3. Other specified hypothyroidism Discussed low TSH being a possible factor to her fatigue.  She is going to follow-up with her provider.   Follow up plan: Return for CPE with PCP (overdue).  Molly Boston, MSN, APRN, FNP-C  Subjective:  HPI: Molly Erickson is a 72 y.o. female presenting on 01/05/2023 for ear pressure (R>L - bilateral pressure in ears (fluid)/Last week seen urgent care [facility] for [facility]/sinus and glands swollen - was given Augmentin 875 and is still taking ) and Vaginitis (From aug 875)  Patient is here with concerns about swollen glands and a vaginal yeast infection.  To me she denies concerns with her ears.  She was seen at urgent care last week due to headaches, sinus issues, and swollen glands.  She was given Augmentin x 10 days and is still taking it.  She reports she is feeling better and the swelling has decreased, but not resolved.  Advil relieves her headaches and she reports she did not have one last night or this morning.  She reports vaginal itching since being on Augmentin.  She is also concerned about ongoing fatigue.  Recent labs indicate a low TSH level.     ROS: Negative unless specifically indicated above in HPI.   Relevant past medical history reviewed and updated as indicated.   Allergies and medications reviewed and updated.   Current Outpatient Medications:    amoxicillin-clavulanate (AUGMENTIN) 875-125 MG tablet, Take 1 tablet by mouth 2 (two) times daily., Disp: , Rfl:    Cholecalciferol (VITAMIN D3) 125 MCG (5000 UT) CAPS, Take by mouth daily., Disp: , Rfl:    Cyanocobalamin (VITAMIN B 12 PO),  Take by mouth., Disp: , Rfl:    liothyronine (CYTOMEL) 5 MCG tablet, Take 15 mcg by mouth daily. Patient takes 5mg , Disp: , Rfl:    omeprazole (PRILOSEC) 40 MG capsule, TAKE ONE CAPSULE TWICE A DAY AT LEAST 30 MINUTES BEFORE MEALS, Disp: 180 capsule, Rfl: 0   raloxifene (EVISTA) 60 MG tablet, , Disp: , Rfl:    SYNTHROID 100 MCG tablet, Take 100 mcg by mouth every morning., Disp: , Rfl:   No Known Allergies  Objective:   BP 118/70   Pulse 68   Temp 97.8 F (36.6 C)   Resp 20   Ht 5\' 7"  (1.702 m)   Wt 167 lb (75.8 kg)   BMI 26.16 kg/m    Physical Exam Vitals reviewed.  Constitutional:      General: She is not in acute distress.    Appearance: Normal appearance. She is not ill-appearing, toxic-appearing or diaphoretic.  HENT:     Head: Normocephalic and atraumatic.     Right Ear: Tympanic membrane, ear canal and external ear normal. There is no impacted cerumen.     Left Ear: Tympanic membrane, ear canal and external ear normal. There is no impacted cerumen.     Nose:     Right Sinus: No maxillary sinus tenderness or frontal sinus tenderness.     Left Sinus: No maxillary sinus tenderness or frontal sinus tenderness.     Mouth/Throat:     Mouth: Mucous membranes are moist.  Pharynx: Oropharynx is clear. No oropharyngeal exudate or posterior oropharyngeal erythema.  Eyes:     General: No scleral icterus.       Right eye: No discharge.        Left eye: No discharge.     Conjunctiva/sclera: Conjunctivae normal.  Cardiovascular:     Rate and Rhythm: Normal rate and regular rhythm.     Heart sounds: Normal heart sounds. No murmur heard.    No friction rub. No gallop.  Pulmonary:     Effort: Pulmonary effort is normal. No respiratory distress.     Breath sounds: Normal breath sounds. No stridor. No wheezing, rhonchi or rales.  Musculoskeletal:        General: Normal range of motion.     Cervical back: Normal range of motion.  Lymphadenopathy:     Head:     Right side of  head: Preauricular adenopathy present.     Cervical: Cervical adenopathy present.  Skin:    General: Skin is warm and dry.     Capillary Refill: Capillary refill takes less than 2 seconds.  Neurological:     General: No focal deficit present.     Mental Status: She is alert and oriented to person, place, and time. Mental status is at baseline.  Psychiatric:        Mood and Affect: Mood normal.        Behavior: Behavior normal.        Thought Content: Thought content normal.        Judgment: Judgment normal.

## 2023-01-21 ENCOUNTER — Encounter (INDEPENDENT_AMBULATORY_CARE_PROVIDER_SITE_OTHER): Payer: Self-pay

## 2023-02-05 ENCOUNTER — Encounter: Payer: Self-pay | Admitting: Internal Medicine

## 2023-02-05 NOTE — Progress Notes (Signed)
Subjective:    Patient ID: Molly Erickson, female    DOB: 02-Aug-1950, 72 y.o.   MRN: [id number]      HPI Natayah is here for a Physical exam and her chronic medical problems.   Saw derm earlier this year (march) - rash on forehead - got worse - ? Shngles put on on valtrex - cleared.  Had a lot of fatigue this spring.    Had significant itching lower legs, and this year she had b/l forearm itching - no rash   Lymph nodes resolved?  Dentist last week still felt enlarged lymph nodes.  She denies any swollen lymph nodes around the ear that she had previously, but can still feel the lymph node in the right side of her neck.  She denies any tenderness.    Medications and allergies reviewed with patient and updated if appropriate.  Current Outpatient Medications on File Prior to Visit  Medication Sig Dispense Refill   Cholecalciferol (VITAMIN D3) 125 MCG (5000 UT) CAPS Take by mouth daily.     Cyanocobalamin (VITAMIN B 12 PO) Take by mouth.     liothyronine (CYTOMEL) 5 MCG tablet Take 15 mcg by mouth daily. Patient takes 5mg      omeprazole (PRILOSEC) 40 MG capsule TAKE ONE CAPSULE TWICE A DAY AT LEAST 30 MINUTES BEFORE MEALS 180 capsule 0   raloxifene (EVISTA) 60 MG tablet      SYNTHROID 100 MCG tablet Take 100 mcg by mouth every morning.     No current facility-administered medications on file prior to visit.    Review of Systems  Constitutional:  Negative for fever.  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   Eyes:  Negative for visual disturbance.  Respiratory:  Positive for shortness of breath (with strenous exertion). Negative for cough and wheezing.   Cardiovascular:  Positive for leg swelling (mild at times). Negative for chest pain and palpitations.  Gastrointestinal:  Positive for constipation. Negative for abdominal pain, blood in stool and diarrhea.       Controlled gerd  Genitourinary:  Negative for dysuria.  Musculoskeletal:   Positive for arthralgias (right knee) and back pain.       Occ left arm pain - tightness  Skin:  Negative for rash.  Neurological:  Negative for dizziness, light-headedness and headaches.  Psychiatric/Behavioral:  Negative for dysphoric mood. The patient is not nervous/anxious.        Objective:   Vitals:   02/06/23 0852  BP: 108/72  Pulse: 62  Temp: 97.9 F (36.6 C)  SpO2: 100%   Filed Weights   02/06/23 0852  Weight: 165 lb (74.8 kg)   Body mass index is 25.84 kg/m.  BP Readings from Last 3 Encounters:  02/06/23 108/72  01/05/23 118/70  10/24/21 124/83    Wt Readings from Last 3 Encounters:  02/06/23 165 lb (74.8 kg)  01/05/23 167 lb (75.8 kg)  10/24/21 160 lb (72.6 kg)       Physical Exam Constitutional: She appears well-developed and well-nourished. No distress.  HENT:  Head: Normocephalic and atraumatic.  Right Ear: External ear normal. Normal ear canal and TM Left Ear: External ear normal.  Normal ear canal and TM Mouth/Throat: Oropharynx is clear and moist.  Eyes: Conjunctivae normal.  Neck: Neck supple. No tracheal deviation present. No thyromegaly present.  No carotid bruit  Cardiovascular: Normal rate, regular rhythm and normal heart sounds.   No murmur heard.  No edema. Pulmonary/Chest: Effort  normal and breath sounds normal. No respiratory distress. She has no wheezes. She has no rales.  Breast: deferred   Abdominal: Soft. She exhibits no distension. There is no tenderness.  Lymphadenopathy: She has no occipital, auricular, posterior cervical or mandibular adenopathy.  Bilateral upper anterior cervical lymphadenopathy-nontender.  No supraclavicular or infraclavicular lymphadenopathy Skin: Skin is warm and dry. She is not diaphoretic.  Psychiatric: She has a normal mood and affect. Her behavior is normal.     Lab Results  Component Value Date   WBC 5.6 04/16/2016   HGB 12.3 04/16/2016   HCT 39 04/16/2016   PLT 315 04/16/2016   GLUCOSE 101  (H) 01/07/2010   CHOL 166 04/16/2016   TRIG 104 04/16/2016   HDL 43 04/16/2016   LDLDIRECT 161.3 01/07/2010   LDLCALC 102 04/16/2016   ALT 8 04/16/2016   AST 14 04/16/2016   NA 143 04/16/2016   K 4.7 04/16/2016   CL 109 01/07/2010   CREATININE 0.7 04/16/2016   BUN 11 04/16/2016   CO2 27 01/07/2010   TSH 3.08 09/17/2011   HGBA1C 5.8 10/18/2008         Assessment & Plan:   Physical exam: Screening blood work  ordered Exercise  trainer, physical therapist  Weight  normal Substance abuse  none   Reviewed recommended immunizations.   Health Maintenance  Topic Date Due   Pneumonia Vaccine 47+ Years old (1 of 1 - PCV) Never done   DEXA SCAN  Never done   MAMMOGRAM  12/03/2016   DTaP/Tdap/Td (2 - Tdap) 01/15/2020   Colonoscopy  08/26/2022   INFLUENZA VACCINE  01/22/2023   COVID-19 Vaccine (6 - 2023-24 season) 02/22/2023 (Originally 02/21/2022)   Zoster Vaccines- Shingrix (1 of 2) 05/09/2023 (Originally 10/26/1999)   Hepatitis C Screening  Completed   HPV VACCINES  Aged Out          See Problem List for Assessment and Plan of chronic medical problems.

## 2023-02-05 NOTE — Patient Instructions (Addendum)
Blood work was ordered.   The lab is on the first floor.    Medications changes include :   none    A Ct scan of your heart was ordered and someone will call you to schedule an appointment.     Return in about 1 year (around 02/06/2024) for Physical Exam.    Health Maintenance, Female Adopting a healthy lifestyle and getting preventive care are important in promoting health and wellness. Ask your health care provider about: The right schedule for you to have regular tests and exams. Things you can do on your own to prevent diseases and keep yourself healthy. What should I know about diet, weight, and exercise? Eat a healthy diet  Eat a diet that includes plenty of vegetables, fruits, low-fat dairy products, and lean protein. Do not eat a lot of foods that are high in solid fats, added sugars, or sodium. Maintain a healthy weight Body mass index (BMI) is used to identify weight problems. It estimates body fat based on height and weight. Your health care provider can help determine your BMI and help you achieve or maintain a healthy weight. Get regular exercise Get regular exercise. This is one of the most important things you can do for your health. Most adults should: Exercise for at least 150 minutes each week. The exercise should increase your heart rate and make you sweat (moderate-intensity exercise). Do strengthening exercises at least twice a week. This is in addition to the moderate-intensity exercise. Spend less time sitting. Even light physical activity can be beneficial. Watch cholesterol and blood lipids Have your blood tested for lipids and cholesterol at 73 years of age, then have this test every 5 years. Have your cholesterol levels checked more often if: Your lipid or cholesterol levels are high. You are older than 73 years of age. You are at high risk for heart disease. What should I know about cancer screening? Depending on your health history and  family history, you may need to have cancer screening at various ages. This may include screening for: Breast cancer. Cervical cancer. Colorectal cancer. Skin cancer. Lung cancer. What should I know about heart disease, diabetes, and high blood pressure? Blood pressure and heart disease High blood pressure causes heart disease and increases the risk of stroke. This is more likely to develop in people who have high blood pressure readings or are overweight. Have your blood pressure checked: Every 3-5 years if you are 72-61 years of age. Every year if you are 40 years old or older. Diabetes Have regular diabetes screenings. This checks your fasting blood sugar level. Have the screening done: Once every three years after age 14 if you are at a normal weight and have a low risk for diabetes. More often and at a younger age if you are overweight or have a high risk for diabetes. What should I know about preventing infection? Hepatitis B If you have a higher risk for hepatitis B, you should be screened for this virus. Talk with your health care provider to find out if you are at risk for hepatitis B infection. Hepatitis C Testing is recommended for: Everyone born from [birth year] through 1965. Anyone with known risk factors for hepatitis C. Sexually transmitted infections (STIs) Get screened for STIs, including gonorrhea and chlamydia, if: You are sexually active and are younger than 73 years of age. You are older than 73 years of age and your health care provider tells you that you are  at risk for this type of infection. Your sexual activity has changed since you were last screened, and you are at increased risk for chlamydia or gonorrhea. Ask your health care provider if you are at risk. Ask your health care provider about whether you are at high risk for HIV. Your health care provider may recommend a prescription medicine to help prevent HIV infection. If you choose to take medicine to prevent HIV,  you should first get tested for HIV. You should then be tested every 3 months for as long as you are taking the medicine. Pregnancy If you are about to stop having your period (premenopausal) and you may become pregnant, seek counseling before you get pregnant. Take 400 to 800 micrograms (mcg) of folic acid every day if you become pregnant. Ask for birth control (contraception) if you want to prevent pregnancy. Osteoporosis and menopause Osteoporosis is a disease in which the bones lose minerals and strength with aging. This can result in bone fractures. If you are 62 years old or older, or if you are at risk for osteoporosis and fractures, ask your health care provider if you should: Be screened for bone loss. Take a calcium or vitamin D supplement to lower your risk of fractures. Be given hormone replacement therapy (HRT) to treat symptoms of menopause. Follow these instructions at home: Alcohol use Do not drink alcohol if: Your health care provider tells you not to drink. You are pregnant, may be pregnant, or are planning to become pregnant. If you drink alcohol: Limit how much you have to: 0-1 drink a day. Know how much alcohol is in your drink. In the [place] one drink equals one 12 oz bottle of beer (355 mL), one 5 oz glass of wine (148 mL), or one 1 oz glass of hard liquor (44 mL). Lifestyle Do not use any products that contain nicotine or tobacco. These products include cigarettes, chewing tobacco, and vaping devices, such as e-cigarettes. If you need help quitting, ask your health care provider. Do not use street drugs. Do not share needles. Ask your health care provider for help if you need support or information about quitting drugs. General instructions Schedule regular health, dental, and eye exams. Stay current with your vaccines. Tell your health care provider if: You often feel depressed. You have ever been abused or do not feel safe at home. Summary Adopting a healthy  lifestyle and getting preventive care are important in promoting health and wellness. Follow your health care provider's instructions about healthy diet, exercising, and getting tested or screened for diseases. Follow your health care provider's instructions on monitoring your cholesterol and blood pressure. This information is not intended to replace advice given to you by your health care provider. Make sure you discuss any questions you have with your health care provider. Document Revised: 10/29/2020 Document Reviewed: 10/29/2020 Elsevier Patient Education  2024 [employer].

## 2023-02-06 ENCOUNTER — Ambulatory Visit: Payer: Commercial Managed Care - PPO | Admitting: Internal Medicine

## 2023-02-06 VITALS — BP 108/72 | HR 62 | Temp 97.9°F | Ht 67.0 in | Wt 165.0 lb

## 2023-02-06 DIAGNOSIS — R209 Unspecified disturbances of skin sensation: Secondary | ICD-10-CM

## 2023-02-06 DIAGNOSIS — E038 Other specified hypothyroidism: Secondary | ICD-10-CM

## 2023-02-06 DIAGNOSIS — M810 Age-related osteoporosis without current pathological fracture: Secondary | ICD-10-CM

## 2023-02-06 DIAGNOSIS — K219 Gastro-esophageal reflux disease without esophagitis: Secondary | ICD-10-CM | POA: Diagnosis not present

## 2023-02-06 DIAGNOSIS — Z0000 Encounter for general adult medical examination without abnormal findings: Secondary | ICD-10-CM

## 2023-02-06 DIAGNOSIS — Z136 Encounter for screening for cardiovascular disorders: Secondary | ICD-10-CM

## 2023-02-06 DIAGNOSIS — E7849 Other hyperlipidemia: Secondary | ICD-10-CM

## 2023-02-06 LAB — LIPID PANEL
Cholesterol: 216 mg/dL — ABNORMAL HIGH (ref 0–200)
HDL: 48.1 mg/dL (ref >39.00)
LDL Cholesterol: 145 mg/dL — ABNORMAL HIGH (ref 0–99)
NonHDL: 167.79
Total CHOL/HDL Ratio: 4
Triglycerides: 115 mg/dL (ref 0.0–149.0)
VLDL: 23 mg/dL (ref 0.0–40.0)

## 2023-02-06 LAB — CBC WITH DIFFERENTIAL/PLATELET
Basophils Absolute: 0 10*3/uL (ref 0.0–0.1)
Basophils Relative: 0.5 % (ref 0.0–3.0)
Eosinophils Absolute: 0.2 10*3/uL (ref 0.0–0.7)
Eosinophils Relative: 3.1 % (ref 0.0–5.0)
HCT: 39.8 % (ref 36.0–46.0)
Hemoglobin: 12.9 g/dL (ref 12.0–15.0)
Lymphocytes Relative: 18.7 % (ref 12.0–46.0)
Lymphs Abs: 1.3 10*3/uL (ref 0.7–4.0)
MCHC: 32.5 g/dL (ref 30.0–36.0)
MCV: 87 fl (ref 78.0–100.0)
Monocytes Absolute: 0.4 10*3/uL (ref 0.1–1.0)
Monocytes Relative: 5.5 % (ref 3.0–12.0)
Neutro Abs: 5 10*3/uL (ref 1.4–7.7)
Neutrophils Relative %: 72.2 % (ref 43.0–77.0)
Platelets: 292 10*3/uL (ref 150.0–400.0)
RBC: 4.58 Mil/uL (ref 3.87–5.11)
RDW: 14.4 % (ref 11.5–15.5)
WBC: 7 10*3/uL (ref 4.0–10.5)

## 2023-02-06 LAB — COMPREHENSIVE METABOLIC PANEL WITH GFR
ALT: 12 U/L (ref 0–35)
AST: 17 U/L (ref 0–37)
Albumin: 4 g/dL (ref 3.5–5.2)
Alkaline Phosphatase: 89 U/L (ref 39–117)
BUN: 17 mg/dL (ref 6–23)
CO2: 26 meq/L (ref 19–32)
Calcium: 9.5 mg/dL (ref 8.4–10.5)
Chloride: 101 meq/L (ref 96–112)
Creatinine, Ser: 0.89 mg/dL (ref 0.40–1.20)
GFR: 64.38 mL/min
Glucose, Bld: 106 mg/dL — ABNORMAL HIGH (ref 70–99)
Potassium: 4.1 meq/L (ref 3.5–5.1)
Sodium: 135 meq/L (ref 135–145)
Total Bilirubin: 0.7 mg/dL (ref 0.2–1.2)
Total Protein: 7.2 g/dL (ref 6.0–8.3)

## 2023-02-06 LAB — VITAMIN D 25 HYDROXY (VIT D DEFICIENCY, FRACTURES): VITD: 39.97 ng/mL (ref 30.00–100.00)

## 2023-02-06 LAB — VITAMIN B12: Vitamin B-12: 350 pg/mL (ref 211–911)

## 2023-02-06 NOTE — Assessment & Plan Note (Signed)
Chronic Management per naturolopath Currently on Synthroid

## 2023-02-06 NOTE — Assessment & Plan Note (Signed)
Chronic Management per GYN-on Evista Continue regular exercise Continue taking calcium, vitamin d

## 2023-02-06 NOTE — Assessment & Plan Note (Signed)
Chronic Following with GI GERD controlled Continue omeprazole 40 mg twice daily

## 2023-02-06 NOTE — Assessment & Plan Note (Signed)
Intermittently if she sensation without rash Check B12 level

## 2023-02-06 NOTE — Assessment & Plan Note (Signed)
Chronic Regular exercise and healthy diet encouraged Check lipid panel  Continue lifestyle control 

## 2023-02-11 ENCOUNTER — Ambulatory Visit (HOSPITAL_BASED_OUTPATIENT_CLINIC_OR_DEPARTMENT_OTHER)
Admission: RE | Admit: 2023-02-11 | Discharge: 2023-02-11 | Disposition: A | Discharge status: Home / Self Care | Payer: Commercial Managed Care - PPO | Source: Ambulatory Visit | Attending: Internal Medicine | Admitting: Internal Medicine

## 2023-02-11 DIAGNOSIS — Z136 Encounter for screening for cardiovascular disorders: Secondary | ICD-10-CM | POA: Insufficient documentation

## 2023-02-12 ENCOUNTER — Telehealth: Payer: Self-pay | Admitting: Internal Medicine

## 2023-02-12 NOTE — Telephone Encounter (Signed)
Inbound call from patient wanting to schedule recall colonoscopy. Patient states she is also having trouble swallowing and food getting stuck in her esophagus. Patient is requesting know if she is able to have endoscopy at the time colonoscopy. Please advise, thank you.

## 2023-02-13 NOTE — Telephone Encounter (Signed)
Please see note below and advise if pt can be scheduled for EGD or if you want her to have an OV prior to that or other testing.

## 2023-02-15 NOTE — Telephone Encounter (Signed)
Yes. Please add EGD with dilation to colonoscopy. Thanks

## 2023-02-16 NOTE — Telephone Encounter (Signed)
See note below from Dr. Marina Goodell pt can be scheduled for endo and colon.

## 2023-02-19 ENCOUNTER — Encounter: Payer: Self-pay | Admitting: Internal Medicine

## 2023-02-25 ENCOUNTER — Telehealth: Payer: Self-pay | Admitting: Internal Medicine

## 2023-02-25 NOTE — Telephone Encounter (Signed)
Patient called and said she tested positive for Covid today and that she is on day 3 of the symptoms. She is currently out of town and wanted to know if Dr. Lawerance Bach would be able to send in medication to help. Patient said the pharmacy is [facility] on [address] in [place], [place] and their phone number is [phone].  Patient said she would like a call back if unable to send in medication. Best callback is [phone].

## 2023-02-26 ENCOUNTER — Encounter: Payer: Self-pay | Admitting: Family Medicine

## 2023-02-26 ENCOUNTER — Telehealth (INDEPENDENT_AMBULATORY_CARE_PROVIDER_SITE_OTHER): Payer: Commercial Managed Care - PPO | Admitting: Family Medicine

## 2023-02-26 DIAGNOSIS — U071 COVID-19: Secondary | ICD-10-CM | POA: Diagnosis not present

## 2023-02-26 MED ORDER — NIRMATRELVIR/RITONAVIR (PAXLOVID)TABLET: 3.0000 | ORAL_TABLET | Freq: Two times a day (BID) | ORAL | 0 refills | Status: AC

## 2023-02-26 NOTE — Progress Notes (Signed)
MyChart Video Visit    Virtual Visit via Video Note    Patient location: Home. Patient and provider in visit Provider location: Office  I discussed the limitations of evaluation and management by telemedicine and the availability of in person appointments. The patient expressed understanding and agreed to proceed.  Visit Date: 02/26/2023  Today's healthcare provider: Hetty Blend, NP-C     Subjective:    Patient ID: Molly Erickson, female    DOB: Jan 16, 1951, 72 y.o.   MRN: [id number]  Chief Complaint  Patient presents with   Covid Positive    HPI  Positive Covid yesterday.  Symptom onset 3 -4 days ago.   C/o fever, chills, body aches, headache, congestion, cough.   Temp 102  relieved with dual action.   Denies dizziness, chest pain, palpitations, shortness of breath, abdominal pain, N/V/D.     Past Medical History:  Diagnosis Date   Abscess of finger 11/15/2010   Allergy    Arthritis    Blood transfusion without reported diagnosis    Bronchitis    Cancer (HCC) 06/2011   Basal cell, Dr Brooke Dare , [place]   CIN I (cervical intraepithelial neoplasia I)    COVID-19 virus infection    Fever blister    GERD (gastroesophageal reflux disease)    Granulomatosis with polyangiitis (HCC)    Hashimoto's thyroiditis    on 100 mcg synthroid   Hematuria    Hyperlipidemia    Hypothyroidism    Neck pain 12/31/2014   Osteopenia    Osteoporosis    Relapsing polychondritis    Thyroid activity decreased    Wegener's syndrome     Past Surgical History:  Procedure Laterality Date   BREAST BIOPSY     Papilloma-Intraductal   BREAST LUMPECTOMY     benign   COLONOSCOPY  2014   COLPOSCOPY     ESOPHAGOGASTRODUODENOSCOPY (EGD) WITH ESOPHAGEAL DILATION  05/01/2021   Dr.Perry   LUNG BIOPSY     UPPER GASTROINTESTINAL ENDOSCOPY  07/2020   [facility]-moderate stenosis/esophagitis   WISDOM TOOTH EXTRACTION      Family History  Problem Relation Age of Onset   Coronary  artery disease Father    Hypertension Father    Prostate cancer Father    Skin cancer Father    Heart disease Father    Breast cancer Mother    Leukemia Mother    Breast cancer Maternal Aunt    Diabetes Daughter    Colon cancer Neg Hx    Esophageal cancer Neg Hx    Rectal cancer Neg Hx    Stomach cancer Neg Hx    Colon polyps Neg Hx     Social History   Socioeconomic History   Marital status: Married    Spouse name: Not on file   Number of children: Not on file   Years of education: Not on file   Highest education level: Not on file  Occupational History   Not on file  Tobacco Use   Smoking status: Never   Smokeless tobacco: Never  Vaping Use   Vaping status: Never Used  Substance and Sexual Activity   Alcohol use: Yes    Alcohol/week: 4.0 standard drinks of alcohol    Types: 4 Standard drinks or equivalent per week    Comment: socially   Drug use: No   Sexual activity: Yes    Birth control/protection: Post-menopausal  Other Topics Concern   Not on file  Social History Narrative   Not on  file   Social Determinants of Health   Financial Resource Strain: Not on file  Food Insecurity: Not on file  Transportation Needs: Not on file  Physical Activity: Not on file  Stress: Not on file  Social Connections: Not on file  Intimate Partner Violence: Not on file    Outpatient Medications Prior to Visit  Medication Sig Dispense Refill   Cholecalciferol (VITAMIN D3) 125 MCG (5000 UT) CAPS Take by mouth daily.     Cyanocobalamin (VITAMIN B 12 PO) Take by mouth.     liothyronine (CYTOMEL) 5 MCG tablet Take 15 mcg by mouth daily. Patient takes 5mg      omeprazole (PRILOSEC) 40 MG capsule TAKE ONE CAPSULE TWICE A DAY AT LEAST 30 MINUTES BEFORE MEALS 180 capsule 0   raloxifene (EVISTA) 60 MG tablet      SYNTHROID 100 MCG tablet Take 100 mcg by mouth every morning.     No facility-administered medications prior to visit.    Allergies  Allergen Reactions   Augmentin  [Amoxicillin-Pot Clavulanate] Diarrhea and Other (See Comments)    ROS     Objective:    Physical Exam  There were no vitals taken for this visit. Wt Readings from Last 3 Encounters:  02/06/23 165 lb (74.8 kg)  01/05/23 167 lb (75.8 kg)  10/24/21 160 lb (72.6 kg)   Alert and oriented and in no acute distress.  Ill-appearing.  Respirations unlabored.  Speaking in complete sentences without difficulty.  Normal facial movements.  Normal speech and mood.    Assessment & Plan:   Problem List Items Addressed This Visit   None Visit Diagnoses     COVID-19 virus infection    -  Primary   Relevant Medications   nirmatrelvir/ritonavir (PAXLOVID) 20 x 150 MG & 10 x 100MG  TABS      No acute distress.  Paxlovid prescribed.  No obvious contraindication to the medication.  Discussed symptomatic management.  Recommend staying well-hydrated and treating fever. Follow up prn.   I am having Molly Erickson start on nirmatrelvir/ritonavir. I am also having her maintain her raloxifene, Vitamin D3, liothyronine, Synthroid, Cyanocobalamin (VITAMIN B 12 PO), and omeprazole.  Meds ordered this encounter  Medications   nirmatrelvir/ritonavir (PAXLOVID) 20 x 150 MG & 10 x 100MG  TABS    Sig: Take 3 tablets by mouth 2 (two) times daily for 5 days. (Take nirmatrelvir 150 mg two tablets twice daily for 5 days and ritonavir 100 mg one tablet twice daily for 5 days) Patient GFR is >60    Dispense:  30 tablet    Refill:  0    Order Specific Question:   Supervising Provider    Answer:   Hillard Danker A [[id number]]    I discussed the assessment and treatment plan with the patient. The patient was provided an opportunity to ask questions and all were answered. The patient agreed with the plan and demonstrated an understanding of the instructions.   The patient was advised to call back or seek an in-person evaluation if the symptoms worsen or if the condition fails to improve as  anticipated.    Hetty Blend, NP-C [facility] [phone] (phone) [phone] (fax)  [facility]

## 2023-02-26 NOTE — Telephone Encounter (Signed)
Appointment made with Vickie today for patient and husband informed.

## 2023-02-26 NOTE — Telephone Encounter (Signed)
Husband called back and would like a call back concerning this.  Please call ASAP  Please call husband - [phone] - Hank

## 2023-03-06 ENCOUNTER — Other Ambulatory Visit: Payer: Self-pay | Admitting: Internal Medicine

## 2023-03-11 ENCOUNTER — Other Ambulatory Visit: Payer: Self-pay

## 2023-03-11 MED ORDER — OMEPRAZOLE 40 MG PO CPDR: 40.0000 mg | DELAYED_RELEASE_CAPSULE | Freq: Every day | ORAL | 1 refills | Status: DC

## 2023-04-06 ENCOUNTER — Ambulatory Visit (AMBULATORY_SURGERY_CENTER): Payer: Commercial Managed Care - PPO

## 2023-04-06 VITALS — Ht 67.0 in | Wt 165.0 lb

## 2023-04-06 DIAGNOSIS — R1310 Dysphagia, unspecified: Secondary | ICD-10-CM

## 2023-04-06 DIAGNOSIS — Z1211 Encounter for screening for malignant neoplasm of colon: Secondary | ICD-10-CM

## 2023-04-06 DIAGNOSIS — K222 Esophageal obstruction: Secondary | ICD-10-CM

## 2023-04-06 MED ORDER — NA SULFATE-K SULFATE-MG SULF 17.5-3.13-1.6 GM/177ML PO SOLN: 1.0000 | Freq: Once | ORAL | 0 refills | Status: AC

## 2023-04-06 NOTE — Progress Notes (Signed)
Pre visit completed via phone call; Patient verified name, DOB, and address; No egg or soy allergy known to patient;  No issues known to pt with past sedation with any surgeries or procedures; Patient denies ever being told they had issues or difficulty with intubation;  No FH of Malignant Hyperthermia; Pt is not on diet pills; Pt is not on home 02;  Pt is not on blood thinners;  Pt reports issues with constipation sometimes-patient advised to increase oral fluids, activity, fruits and veggies as allowed; can also take OTC stool softener/laxative;  No A fib or A flutter; Have any cardiac testing pending--NO Insurance verified during PV appt--- UHC  Pt can ambulate without assistance;  Pt denies use of chewing tobacco; Discussed diabetic/weight loss medication holds; Discussed NSAID holds; Checked BMI to be less than 50; Pt instructed to use [employer] or GoodRx for a price reduction on prep;  Patient's chart reviewed by Cathlyn Parsons CNRA prior to previsit and patient appropriate for the [facility];  Pre visit completed and red dot placed by patient's name on their procedure day (on provider's schedule);    Instructions sent to MyChart as well as printed and mailed to the patient per her request;

## 2023-04-17 ENCOUNTER — Encounter: Payer: Self-pay | Admitting: Internal Medicine

## 2023-04-27 ENCOUNTER — Ambulatory Visit: Payer: Commercial Managed Care - PPO | Admitting: Internal Medicine

## 2023-04-27 ENCOUNTER — Encounter: Payer: Self-pay | Admitting: Internal Medicine

## 2023-04-27 VITALS — BP 127/75 | HR 63 | Temp 97.5°F | Resp 11 | Ht 67.0 in | Wt 165.0 lb

## 2023-04-27 DIAGNOSIS — K222 Esophageal obstruction: Secondary | ICD-10-CM | POA: Diagnosis not present

## 2023-04-27 DIAGNOSIS — R1310 Dysphagia, unspecified: Secondary | ICD-10-CM

## 2023-04-27 DIAGNOSIS — Z1211 Encounter for screening for malignant neoplasm of colon: Secondary | ICD-10-CM | POA: Diagnosis present

## 2023-04-27 DIAGNOSIS — D125 Benign neoplasm of sigmoid colon: Secondary | ICD-10-CM | POA: Diagnosis not present

## 2023-04-27 DIAGNOSIS — K219 Gastro-esophageal reflux disease without esophagitis: Secondary | ICD-10-CM

## 2023-04-27 DIAGNOSIS — D124 Benign neoplasm of descending colon: Secondary | ICD-10-CM

## 2023-04-27 MED ORDER — OMEPRAZOLE 40 MG PO CPDR: 40.0000 mg | DELAYED_RELEASE_CAPSULE | Freq: Two times a day (BID) | ORAL | 4 refills | Status: DC

## 2023-04-27 MED ORDER — SODIUM CHLORIDE 0.9 % IV SOLN: 500.0000 mL | INTRAVENOUS | Status: DC

## 2023-04-27 NOTE — Progress Notes (Signed)
To pacu, VSS. Report to [email or web address] 

## 2023-04-27 NOTE — Progress Notes (Signed)
VS by [facility]  Pt's states no medical or surgical changes since previsit or office visit.

## 2023-04-27 NOTE — Op Note (Signed)
[facility] Patient Name: Molly Erickson Procedure Date: 04/27/2023 10:32 AM MRN: [id number] Endoscopist: Wilhemina Bonito. Marina Goodell , MD, [id number] Age: 72 Referring MD:  Date of Birth: [birth year] Gender: Female Account #: [id number] Procedure:                Colonoscopy with cold snare polypectomy x 3. Indications:              Screening for colorectal malignant neoplasm.                            Previous exams 2004 and 2014 were negative for                            neoplasia. Medicines:                Monitored Anesthesia Care Procedure:                Pre-Anesthesia Assessment:                           - Prior to the procedure, a History and Physical                            was performed, and patient medications and                            allergies were reviewed. The patient's tolerance of                            previous anesthesia was also reviewed. The risks                            and benefits of the procedure and the sedation                            options and risks were discussed with the patient.                            All questions were answered, and informed consent                            was obtained. Prior Anticoagulants: The patient has                            taken no anticoagulant or antiplatelet agents. ASA                            Grade Assessment: II - A patient with mild systemic                            disease. After reviewing the risks and benefits,                            the patient was deemed in satisfactory condition to  undergo the procedure.                           After obtaining informed consent, the colonoscope                            was passed under direct vision. Throughout the                            procedure, the patient's blood pressure, pulse, and                            oxygen saturations were monitored continuously. The                            Olympus Scope SN  [id number] was introduced through the                            anus and advanced to the the cecum, identified by                            appendiceal orifice and ileocecal valve. The                            ileocecal valve, appendiceal orifice, and rectum                            were photographed. The quality of the bowel                            preparation was excellent. The colonoscopy was                            performed without difficulty. The patient tolerated                            the procedure well. The bowel preparation used was                            SUPREP via split dose instruction. Scope In: 10:40:46 AM Scope Out: 10:56:13 AM Scope Withdrawal Time: 0 hours 11 minutes 44 seconds  Total Procedure Duration: 0 hours 15 minutes 27 seconds  Findings:                 Three polyps were found in the sigmoid colon and                            descending colon. The polyps were 1 to 3 mm in                            size. These polyps were removed with a cold snare.                            Resection and retrieval were complete.  The exam was otherwise without abnormality on                            direct and retroflexion views. Complications:            No immediate complications. Estimated blood loss:                            None. Estimated Blood Loss:     Estimated blood loss: none. Impression:               - Three 1 to 3 mm polyps in the sigmoid colon and                            in the descending colon, removed with a cold snare.                            Resected and retrieved.                           - The examination was otherwise normal on direct                            and retroflexion views. Recommendation:           - Repeat colonoscopy is not recommended for                            surveillance.                           - Patient has a contact number available for                            emergencies.  The signs and symptoms of potential                            delayed complications were discussed with the                            patient. Return to normal activities tomorrow.                            Written discharge instructions were provided to the                            patient.                           - Resume previous diet.                           - Continue present medications.                           - Await pathology results. Wilhemina Bonito. Marina Goodell, MD 04/27/2023 11:02:30 AM This report has been signed electronically.

## 2023-04-27 NOTE — Progress Notes (Signed)
Called to room to assist during endoscopic procedure.  Patient ID and intended procedure confirmed with present staff. Received instructions for my participation in the procedure from the performing physician.  

## 2023-04-27 NOTE — Progress Notes (Signed)
HISTORY OF PRESENT ILLNESS:  Molly Erickson is a 72 y.o. female with GERD complicated by peptic stricture who presents today for upper endoscopy with esophageal dilation (recurrent dysphagia) and screening colonoscopy.  Previous colonoscopy in 2004 and 2014 were negative for neoplasia.  No complaints  REVIEW OF SYSTEMS:  All non-GI ROS negative except for  Past Medical History:  Diagnosis Date   Abscess of finger 11/15/2010   Allergy    Arthritis    Blood transfusion without reported diagnosis    Bronchitis    Cancer (HCC) 06/2011   Basal cell, Dr Brooke Dare , [place]   CIN I (cervical intraepithelial neoplasia I)    COVID-19 virus infection    Fever blister    GERD (gastroesophageal reflux disease)    on meds   Granulomatosis with polyangiitis (HCC)    Hashimoto's thyroiditis    on 100 mcg synthroid   Hematuria    Hyperlipidemia    diet controlled   Hypothyroidism    on meds   Neck pain 12/31/2014   Osteopenia    Osteoporosis    Relapsing polychondritis    Thyroid activity decreased    on meds   Wegener's syndrome     Past Surgical History:  Procedure Laterality Date   BREAST BIOPSY     Papilloma-Intraductal   BREAST LUMPECTOMY     benign   COLONOSCOPY  2014   DB-MAC-moviprep(good)-HPP   COLPOSCOPY     ESOPHAGOGASTRODUODENOSCOPY (EGD) WITH ESOPHAGEAL DILATION  05/01/2021   Dr.Maysen Sudol   LUNG BIOPSY     UPPER GASTROINTESTINAL ENDOSCOPY  07/2020   JP-MAC-moderate stenosis/esophagitis   UPPER GASTROINTESTINAL ENDOSCOPY  2023   JP-dilation 18-20 mm   WISDOM TOOTH EXTRACTION      Social History Molly Erickson  reports that she has never smoked. She has never used smokeless tobacco. She reports current alcohol use of about 4.0 standard drinks of alcohol per week. She reports that she does not use drugs.  family history includes Breast cancer in her maternal aunt and mother; Coronary artery disease in her father; Diabetes in her daughter; Heart disease in her  father; Hypertension in her father; Leukemia in her mother; Prostate cancer in her father; Skin cancer in her father.  Allergies  Allergen Reactions   Augmentin [Amoxicillin-Pot Clavulanate] Diarrhea       PHYSICAL EXAMINATION: Vital signs: BP 121/63   Pulse 83   Temp (!) 97.5 F (36.4 C) (Skin)   Ht 5\' 7"  (1.702 m)   Wt 165 lb (74.8 kg)   SpO2 99%   BMI 25.84 kg/m  General: Well-developed, well-nourished, no acute distress HEENT: Sclerae are anicteric, conjunctiva pink. Oral mucosa intact Lungs: Clear Heart: Regular Abdomen: soft, nontender, nondistended, no obvious ascites, no peritoneal signs, normal bowel sounds. No organomegaly. Extremities: No edema Psychiatric: alert and oriented x3. Cooperative     ASSESSMENT:  1.  GERD complicated by peptic stricture 2.  Recurrent dysphagia 3.  Colon cancer screening   PLAN:  1.  Upper endoscopy with esophageal dilation 2.  Colonoscopy

## 2023-04-27 NOTE — Op Note (Signed)
[facility] Patient Name: Molly Erickson Procedure Date: 04/27/2023 10:31 AM MRN: [id number] Endoscopist: Wilhemina Bonito. Marina Goodell , MD, [id number] Age: 72 Referring MD:  Date of Birth: 05/02/1951 Gender: Female Account #: [id number] Procedure:                Upper GI endoscopy with balloon dilation of the                            esophagus. 18 mm max Indications:              Dysphagia, Therapeutic procedure, Esophageal reflux Medicines:                Monitored Anesthesia Care Procedure:                Pre-Anesthesia Assessment:                           - Prior to the procedure, a History and Physical                            was performed, and patient medications and                            allergies were reviewed. The patient's tolerance of                            previous anesthesia was also reviewed. The risks                            and benefits of the procedure and the sedation                            options and risks were discussed with the patient.                            All questions were answered, and informed consent                            was obtained. Prior Anticoagulants: The patient has                            taken no anticoagulant or antiplatelet agents.                            After reviewing the risks and benefits, the patient                            was deemed in satisfactory condition to undergo the                            procedure.                           After obtaining informed consent, the endoscope was  passed under direct vision. Throughout the                            procedure, the patient's blood pressure, pulse, and                            oxygen saturations were monitored continuously. The                            GIF [id number] #[id number] was introduced through the                            mouth, and advanced to the second part of duodenum.                            The upper GI  endoscopy was accomplished without                            difficulty. The patient tolerated the procedure                            well. Scope In: Scope Out: Findings:                 One benign-appearing, intrinsic moderate stenosis                            was found 35 cm from the incisors. This stenosis                            measured 1.3 cm (inner diameter). A TTS dilator was                            passed through the scope. Dilation with an 18-19-20                            mm balloon dilator was performed to 18 mm. There                            was good disruption of the ringlike stricture. See                            images                           The exam of the esophagus was otherwise normal.                           The stomach was normal except for a 5 cm hiatal                            hernia.                           The examined duodenum was normal.  The cardia and gastric fundus were normal on                            retroflexion. Complications:            No immediate complications. Estimated Blood Loss:     Estimated blood loss: none. Impression:               - Benign-appearing esophageal stenosis. Dilated.                           - Normal stomach. Hiatal hernia                           - Normal examined duodenum.                           - No specimens collected. Recommendation:           - Patient has a contact number available for                            emergencies. The signs and symptoms of potential                            delayed complications were discussed with the                            patient. Return to normal activities tomorrow.                            Written discharge instructions were provided to the                            patient.                           - Post dilation diet.                           - Continue present medications.                           - Please  refill omeprazole 40 mg p.o. twice daily.                            Multiple refills                           - Repeat EGD with dilation in the [facility] in 4 to 6                            weeks Marquise Lambson N. Marina Goodell, MD 04/27/2023 11:16:25 AM This report has been signed electronically.

## 2023-04-27 NOTE — Patient Instructions (Addendum)
- Repeat colonoscopy is not recommended for                            surveillance.                           - Patient has a contact number available for                            emergencies. The signs and symptoms of potential                            delayed complications were discussed with the                            patient. Return to normal activities tomorrow.                            Written discharge instructions were provided to the                            patient.                           - Continue present medications.                           - Await pathology results. (3 polyps removed and sent to pathology)                           - Post dilation diet.                           - Continue present medications.                           - Please refill omeprazole 40 mg p.o. twice daily.                            Multiple refills                           - Repeat EGD with dilation in the [facility] in 4 to 6                            Weeks.  Scheduled.  YOU HAD AN ENDOSCOPIC PROCEDURE TODAY AT THE [facility]:   Refer to the procedure report that was given to you for any specific questions about what was found during the examination.  If the procedure report does not answer your questions, please call your gastroenterologist to clarify.  If you requested that your care partner not be given the details of your procedure findings, then the procedure report has been included in a sealed envelope for you to review at your convenience later.  YOU SHOULD EXPECT: Some feelings of bloating in the abdomen. Passage of more gas than usual.  Walking can help get rid  of the air that was put into your GI tract during the procedure and reduce the bloating. If you had a lower endoscopy (such as a colonoscopy or flexible sigmoidoscopy) you may notice spotting of blood in your stool or on the toilet paper. If you underwent a bowel prep for your  procedure, you may not have a normal bowel movement for a few days.  Please Note:  You might notice some irritation and congestion in your nose or some drainage.  This is from the oxygen used during your procedure.  There is no need for concern and it should clear up in a day or so.  SYMPTOMS TO REPORT IMMEDIATELY:  Following lower endoscopy (colonoscopy or flexible sigmoidoscopy):  Excessive amounts of blood in the stool  Significant tenderness or worsening of abdominal pains  Swelling of the abdomen that is new, acute  Fever of 100F or higher  Following upper endoscopy (EGD)  Vomiting of blood or coffee ground material  New chest pain or pain under the shoulder blades  Painful or persistently difficult swallowing  New shortness of breath  Fever of 100F or higher  Black, tarry-looking stools  For urgent or emergent issues, a gastroenterologist can be reached at any hour by calling [phone]. Do not use MyChart messaging for urgent concerns.    DIET:  We do recommend a small meal at first, but then you may proceed to your regular diet.  Drink plenty of fluids but you should avoid alcoholic beverages for 24 hours.  ACTIVITY:  You should plan to take it easy for the rest of today and you should NOT DRIVE or use heavy machinery until tomorrow (because of the sedation medicines used during the test).    FOLLOW UP: Our staff will call the number listed on your records the next business day following your procedure.  We will call around 7:15- 8:00 am to check on you and address any questions or concerns that you may have regarding the information given to you following your procedure. If we do not reach you, we will leave a message.     If any biopsies were taken you will be contacted by phone or by letter within the next 1-3 weeks.  Please call us at [phone] if you have not heard about the biopsies in 3 weeks.    SIGNATURES/CONFIDENTIALITY: You and/or your care partner  have signed paperwork which will be entered into your electronic medical record.  These signatures attest to the fact that that the information above on your After Visit Summary has been reviewed and is understood.  Full responsibility of the confidentiality of this discharge information lies with you and/or your care-partner.

## 2023-04-28 ENCOUNTER — Telehealth: Payer: Self-pay | Admitting: *Deleted

## 2023-04-28 NOTE — Telephone Encounter (Signed)
  Follow up Call-     04/27/2023    9:51 AM 10/24/2021   12:55 PM 05/01/2021    9:32 AM 09/18/2020    9:22 AM  Call back number  Post procedure Call Back phone  # [phone]  Permission to leave phone message Yes Yes Yes Yes     Patient questions:  Do you have a fever, pain , or abdominal swelling? No. Pain Score  0 *  Have you tolerated food without any problems? Yes.    Have you been able to return to your normal activities? Yes.    Do you have any questions about your discharge instructions: Diet   No. Medications  No. Follow up visit  No.  Do you have questions or concerns about your Care? No.  Actions: * If pain score is 4 or above: No action needed, pain <4.

## 2023-04-29 ENCOUNTER — Encounter: Payer: Self-pay | Admitting: Internal Medicine

## 2023-04-29 LAB — SURGICAL PATHOLOGY

## 2023-05-26 ENCOUNTER — Encounter: Payer: Self-pay | Admitting: Internal Medicine

## 2023-06-08 ENCOUNTER — Encounter: Payer: Self-pay | Admitting: Internal Medicine

## 2023-06-08 ENCOUNTER — Ambulatory Visit (AMBULATORY_SURGERY_CENTER): Payer: Commercial Managed Care - PPO | Admitting: Internal Medicine

## 2023-06-08 VITALS — BP 141/69 | HR 73 | Temp 97.4°F | Resp 22 | Ht 67.0 in | Wt 165.0 lb

## 2023-06-08 DIAGNOSIS — Q399 Congenital malformation of esophagus, unspecified: Secondary | ICD-10-CM | POA: Diagnosis not present

## 2023-06-08 DIAGNOSIS — K222 Esophageal obstruction: Secondary | ICD-10-CM | POA: Diagnosis present

## 2023-06-08 DIAGNOSIS — K449 Diaphragmatic hernia without obstruction or gangrene: Secondary | ICD-10-CM | POA: Diagnosis not present

## 2023-06-08 DIAGNOSIS — K219 Gastro-esophageal reflux disease without esophagitis: Secondary | ICD-10-CM

## 2023-06-08 DIAGNOSIS — R1310 Dysphagia, unspecified: Secondary | ICD-10-CM

## 2023-06-08 MED ORDER — SODIUM CHLORIDE 0.9 % IV SOLN: 500.0000 mL | Freq: Once | INTRAVENOUS | Status: DC

## 2023-06-08 NOTE — Patient Instructions (Signed)
YOU HAD AN ENDOSCOPIC PROCEDURE TODAY: Refer to the procedure report and other information in the discharge instructions given to you for any specific questions about what was found during the examination. If this information does not answer your questions, please call [place] office at [phone] to clarify.   YOU SHOULD EXPECT: Some feelings of bloating in the abdomen. Passage of more gas than usual. Walking can help get rid of the air that was put into your GI tract during the procedure and reduce the bloating. If you had a lower endoscopy (such as a colonoscopy or flexible sigmoidoscopy) you may notice spotting of blood in your stool or on the toilet paper. Some abdominal soreness may be present for a day or two, also.  DIET: See Dilation handout Your first meal following the procedure should be a light meal and then it is ok to progress to your normal diet. A half-sandwich or bowl of soup is an example of a good first meal. Heavy or fried foods are harder to digest and may make you feel nauseous or bloated. Drink plenty of fluids but you should avoid alcoholic beverages for 24 hours. If you had a esophageal dilation, please see attached instructions for diet.    ACTIVITY: Your care partner should take you home directly after the procedure. You should plan to take it easy, moving slowly for the rest of the day. You can resume normal activity the day after the procedure however YOU SHOULD NOT DRIVE, use power tools, machinery or perform tasks that involve climbing or major physical exertion for 24 hours (because of the sedation medicines used during the test).   SYMPTOMS TO REPORT IMMEDIATELY: A gastroenterologist can be reached at any hour. Please call [phone]  for any of the following symptoms:  Following upper endoscopy (EGD, , esophageal dilation) Vomiting of blood or coffee ground material  New, significant abdominal pain  New, significant chest pain or pain under the shoulder blades   Painful or persistently difficult swallowing  New shortness of breath  Black, tarry-looking or red, bloody stools  FOLLOW UP:  If any biopsies were taken you will be contacted by phone or by letter within the next 1-3 weeks. Call [phone]  if you have not heard about the biopsies in 3 weeks.  Please also call with any specific questions about appointments or follow up tests.

## 2023-06-08 NOTE — Progress Notes (Signed)
Pt's states no medical or surgical changes since previous EGD/Colonoscopy on 04/27/23.

## 2023-06-08 NOTE — Progress Notes (Signed)
HISTORY OF PRESENT ILLNESS:  Molly Erickson is a 72 y.o. female with GERD complicated by esophageal stricturing.  Now for EGD with dilation for ongoing dysphagia  REVIEW OF SYSTEMS:  All non-GI ROS negative except for  Past Medical History:  Diagnosis Date   Abscess of finger 11/15/2010   Allergy    Arthritis    Blood transfusion without reported diagnosis    Bronchitis    Cancer (HCC) 06/2011   Basal cell, Dr Brooke Dare , [place]   CIN I (cervical intraepithelial neoplasia I)    COVID-19 virus infection    Fever blister    GERD (gastroesophageal reflux disease)    on meds   Granulomatosis with polyangiitis (HCC)    Hashimoto's thyroiditis    on 100 mcg synthroid   Hematuria    Hyperlipidemia    diet controlled   Hypothyroidism    on meds   Neck pain 12/31/2014   Osteopenia    Osteoporosis    Relapsing polychondritis    Thyroid activity decreased    on meds   Wegener's syndrome     Past Surgical History:  Procedure Laterality Date   BREAST BIOPSY     Papilloma-Intraductal   BREAST LUMPECTOMY     benign   COLONOSCOPY  2014   DB-MAC-moviprep(good)-HPP   COLPOSCOPY     ESOPHAGOGASTRODUODENOSCOPY (EGD) WITH ESOPHAGEAL DILATION  05/01/2021   Dr.Sue Fernicola   LUNG BIOPSY     UPPER GASTROINTESTINAL ENDOSCOPY  07/2020   JP-MAC-moderate stenosis/esophagitis   UPPER GASTROINTESTINAL ENDOSCOPY  2023   JP-dilation 18-20 mm   WISDOM TOOTH EXTRACTION      Social History Molly Erickson  reports that she has never smoked. She has never used smokeless tobacco. She reports current alcohol use of about 4.0 standard drinks of alcohol per week. She reports that she does not use drugs.  family history includes Breast cancer in her maternal aunt and mother; Coronary artery disease in her father; Diabetes in her daughter; Heart disease in her father; Hypertension in her father; Leukemia in her mother; Prostate cancer in her father; Skin cancer in her father.  Allergies  Allergen  Reactions   Augmentin [Amoxicillin-Pot Clavulanate] Diarrhea    Pt states she can take Amoxicillin by itself with no issues       PHYSICAL EXAMINATION: Vital signs: BP 122/73   Pulse 74   Temp (!) 97.4 F (36.3 C) (Temporal)   Ht 5\' 7"  (1.702 m)   Wt 165 lb (74.8 kg)   SpO2 100%   BMI 25.84 kg/m  General: Well-developed, well-nourished, no acute distress HEENT: Sclerae are anicteric, conjunctiva pink. Oral mucosa intact Lungs: Clear Heart: Regular Abdomen: soft, nontender, nondistended, no obvious ascites, no peritoneal signs, normal bowel sounds. No organomegaly. Extremities: No edema Psychiatric: alert and oriented x3. Cooperative     ASSESSMENT:  GERD with peptic stricture and dysphagia   PLAN:  EGD with esophageal dilation

## 2023-06-08 NOTE — Progress Notes (Signed)
Vss nad trans to pacu 

## 2023-06-08 NOTE — Op Note (Signed)
[facility] Patient Name: Molly Erickson Procedure Date: 06/08/2023 10:08 AM MRN: [id number] Endoscopist: Wilhemina Bonito. Marina Goodell , MD, [id number] Age: 72 Referring MD:  Date of Birth: [birth year] Gender: Female Account #: [id number] Procedure:                Upper GI endoscopy with balloon dilation of the                            esophagus. 19 mm max Indications:              Therapeutic procedure, Dysphagia, Esophageal                            reflux. Most recent dilation April 27 2023 to 18                            mm Medicines:                Monitored Anesthesia Care Procedure:                Pre-Anesthesia Assessment:                           - Prior to the procedure, a History and Physical                            was performed, and patient medications and                            allergies were reviewed. The patient's tolerance of                            previous anesthesia was also reviewed. The risks                            and benefits of the procedure and the sedation                            options and risks were discussed with the patient.                            All questions were answered, and informed consent                            was obtained. Prior Anticoagulants: The patient has                            taken no anticoagulant or antiplatelet agents. ASA                            Grade Assessment: II - A patient with mild systemic                            disease. After reviewing the risks and benefits,  the patient was deemed in satisfactory condition to                            undergo the procedure.                           After obtaining informed consent, the endoscope was                            passed under direct vision. Throughout the                            procedure, the patient's blood pressure, pulse, and                            oxygen saturations were monitored continuously. The                             GIF [id number] #[id number] was introduced through the                            mouth, and advanced to the second part of duodenum.                            The upper GI endoscopy was accomplished without                            difficulty. The patient tolerated the procedure                            well. Scope In: Scope Out: Findings:                 The esophagus was somewhat tortuous. 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 No active inflammation.. A TTS dilator                            was passed through the scope. Dilation with an                            18-19-20 mm balloon dilator was performed to 19 mm.                            Good mucosal ring disruption at 19 mm.                           The stomach was normal, save moderate-sized hiatal                            hernia.  The examined duodenum was normal.                           The cardia and gastric fundus were normal on                            retroflexion. Complications:            No immediate complications. Estimated Blood Loss:     Estimated blood loss: none. Impression:               1. Esophageal stricture status post dilation?"19 mm                            max                           2. Moderate hiatal hernia                           3. Otherwise unremarkable EGD Recommendation:           - Patient has a contact number available for                            emergencies. The signs and symptoms of potential                            delayed complications were discussed with the                            patient. Return to normal activities tomorrow.                            Written discharge instructions were provided to the                            patient.                           - Post dilation diet.                           - Continue present  medications.                           ?" Repeat EGD with dilation in 2 to 3 months Dhruva Orndoff N. Marina Goodell, MD 06/08/2023 10:25:32 AM This report has been signed electronically.

## 2023-06-08 NOTE — Progress Notes (Signed)
Called to room to assist during endoscopic procedure.  Patient ID and intended procedure confirmed with present staff. Received instructions for my participation in the procedure from the performing physician.  

## 2023-06-09 ENCOUNTER — Telehealth: Payer: Self-pay | Admitting: *Deleted

## 2023-06-09 NOTE — Telephone Encounter (Signed)
  Follow up Call-     06/08/2023    9:29 AM 04/27/2023    9:51 AM 10/24/2021   12:55 PM 05/01/2021    9:32 AM 09/18/2020    9:22 AM  Call back number  Post procedure Call Back phone  # [phone]  Permission to leave phone message Yes Yes Yes Yes Yes     Patient questions:  Do you have a fever, pain , or abdominal swelling? No. Pain Score  0 *  Have you tolerated food without any problems? Yes.    Have you been able to return to your normal activities? Yes.    Do you have any questions about your discharge instructions: Diet   No. Medications  No. Follow up visit  No.  Do you have questions or concerns about your Care? No.  Actions: * If pain score is 4 or above: No action needed, pain <4.

## 2023-06-09 NOTE — Telephone Encounter (Signed)
  Follow up Call-     06/08/2023    9:29 AM 04/27/2023    9:51 AM 10/24/2021   12:55 PM 05/01/2021    9:32 AM 09/18/2020    9:22 AM  Call back number  Post procedure Call Back phone  # [phone]  Permission to leave phone message Yes Yes Yes Yes Yes     Patient questions:  Do you have a fever, pain , or abdominal swelling? No. Pain Score  0 *  Have you tolerated food without any problems? Yes.    Have you been able to return to your normal activities? Yes.    Do you have any questions about your discharge instructions: Diet   No. Medications  No. Follow up visit  No.  Do you have questions or concerns about your Care? No.  Actions: * If pain score is 4 or above: No action needed, pain <4.   Follow up Call-     06/08/2023    9:29 AM 04/27/2023    9:51 AM 10/24/2021   12:55 PM 05/01/2021    9:32 AM 09/18/2020    9:22 AM  Call back number  Post procedure Call Back phone  # [phone]  Permission to leave phone message Yes Yes Yes Yes Yes     Patient questions:  Do you have a fever, pain , or abdominal swelling? No. Pain Score  0 *  Have you tolerated food without any problems? Yes.    Have you been able to return to your normal activities? Yes.    Do you have any questions about your discharge instructions: Diet   No. Medications  No. Follow up visit  No.  Do you have questions or concerns about your Care? No.  Actions: * If pain score is 4 or above: No action needed, pain <4.

## 2023-08-04 ENCOUNTER — Ambulatory Visit (AMBULATORY_SURGERY_CENTER): Payer: Commercial Managed Care - PPO | Admitting: *Deleted

## 2023-08-04 VITALS — Ht 67.0 in | Wt 165.0 lb

## 2023-08-04 DIAGNOSIS — R1310 Dysphagia, unspecified: Secondary | ICD-10-CM

## 2023-08-04 DIAGNOSIS — K222 Esophageal obstruction: Secondary | ICD-10-CM

## 2023-08-04 DIAGNOSIS — K2100 Gastro-esophageal reflux disease with esophagitis, without bleeding: Secondary | ICD-10-CM

## 2023-08-04 NOTE — Progress Notes (Signed)
Pt's name and DOB verified at the beginning of the pre-visit wit 2 identifiers  Pt denies any difficulty with ambulating,sitting, laying down or rolling side to side  Pt has no issues with ambulation   Pt has no issues moving head neck or swallowing  No egg or soy allergy known to patient   No issues known to pt with past sedation with any surgeries or procedures  Pt denies having issues being intubated  No FH of Malignant Hyperthermia  Pt is not on diet pills or shots  Pt is not on home 02   Pt is not on blood thinners   Pt denies issues with constipation   Pt is not on dialysis  Pt denise any abnormal heart rhythms   Pt denies any upcoming cardiac testing  Patient's chart reviewed by Cathlyn Parsons CNRA prior to pre-visit and patient appropriate for the [facility].  Pre-visit completed and red dot placed by patient's name on their procedure day (on provider's schedule).    Chart not reviewed by CRNA prior to [facility]  Visit by phone   Pt states weight is 165 lb   IInstructions reviewed. Pt given [facility], [facility] main # and MD on call # prior to instructions.  Pt states understanding of instructions. Instructed to review again prior to procedure. Pt states they will.   Informed pt that they will receive a text or  call from [facility] regarding there prep med.

## 2023-08-14 ENCOUNTER — Encounter: Payer: Self-pay | Admitting: Internal Medicine

## 2023-08-16 NOTE — Patient Instructions (Incomplete)
   Medications changes include :   fluoxetine 20 mg daily,  xanax 0.5 mg twice daily as needed for panic attack.  Buspar 5 mg at night as needed for sleep.

## 2023-08-16 NOTE — Progress Notes (Unsigned)
    Subjective:    Patient ID: Molly Erickson, female    DOB: 12-12-1950, 73 y.o.   MRN: [id number]      HPI Molly Erickson is here for No chief complaint on file.   Flu 6 weeks ago, still coughing, on abx -    Recent xrays show heart is enlarged -    ? Cholesterol medication - ct cac 46.4 (01/2023)    Last LDL 145 ( 01/2023)    Medications and allergies reviewed with patient and updated if appropriate.  Current Outpatient Medications on File Prior to Visit  Medication Sig Dispense Refill   acyclovir (ZOVIRAX) 200 MG capsule Take 200 mg by mouth. (Patient not taking: Reported on 08/04/2023)     amoxicillin-clavulanate (AUGMENTIN) 875-125 MG tablet Take 1 tablet by mouth 2 (two) times daily.     azithromycin (ZITHROMAX) 250 MG tablet Take by mouth.     Cholecalciferol (VITAMIN D3) 125 MCG (5000 UT) CAPS Take by mouth daily.     Cyanocobalamin (VITAMIN B 12 PO) Take 1 tablet by mouth daily at 6 (six) AM.     liothyronine (CYTOMEL) 5 MCG tablet Take 15 mcg by mouth daily. Patient takes 5mg      omeprazole (PRILOSEC) 40 MG capsule Take 1 capsule (40 mg total) by mouth in the morning and at bedtime. 180 capsule 4   OVER THE COUNTER MEDICATION Probiotics     raloxifene (EVISTA) 60 MG tablet Take 60 mg by mouth daily.     SYNTHROID 100 MCG tablet Take 100 mcg by mouth every morning.     triamcinolone cream (KENALOG) 0.1 % Apply 1 Application topically. (Patient not taking: Reported on 08/04/2023)     No current facility-administered medications on file prior to visit.    Review of Systems     Objective:  There were no vitals filed for this visit. BP Readings from Last 3 Encounters:  06/08/23 (!) 141/69  04/27/23 127/75  02/06/23 108/72   Wt Readings from Last 3 Encounters:  08/04/23 165 lb (74.8 kg)  06/08/23 165 lb (74.8 kg)  04/27/23 165 lb (74.8 kg)   There is no height or weight on file to calculate BMI.    Physical Exam         Assessment & Plan:    See  Problem List for Assessment and Plan of chronic medical problems.

## 2023-08-17 ENCOUNTER — Encounter: Payer: Self-pay | Admitting: Internal Medicine

## 2023-08-17 ENCOUNTER — Ambulatory Visit: Payer: Commercial Managed Care - PPO | Admitting: Internal Medicine

## 2023-08-17 VITALS — BP 100/82 | HR 64 | Temp 98.1°F | Resp 18 | Ht 67.0 in | Wt 164.0 lb

## 2023-08-17 DIAGNOSIS — I517 Cardiomegaly: Secondary | ICD-10-CM | POA: Insufficient documentation

## 2023-08-17 DIAGNOSIS — I2510 Atherosclerotic heart disease of native coronary artery without angina pectoris: Secondary | ICD-10-CM | POA: Insufficient documentation

## 2023-08-17 DIAGNOSIS — J189 Pneumonia, unspecified organism: Secondary | ICD-10-CM | POA: Diagnosis not present

## 2023-08-17 NOTE — Assessment & Plan Note (Signed)
 CT CAC August 2024 with CAC score of 46.4 Mild disease Discussed pros and cons of statin She will think about it At a minimum we will consider repeating CT CAC in 3-5 years to reassess risk Stressed regular exercise, healthy diet

## 2023-08-17 NOTE — Assessment & Plan Note (Signed)
 Recent flu and probable early community-acquired pneumonia or secondary pneumonia from flu Completed treatment Residual cough which is improving Continue symptomatic treatment

## 2023-08-17 NOTE — Assessment & Plan Note (Signed)
 New Seen on 2 recent chest x-rays Most recent chest x-ray here did not show that, no evidence of that on CT CAC from August of last year Will order echocardiogram Does have some dyspnea on exertion, but could be related to deconditioning

## 2023-08-18 ENCOUNTER — Ambulatory Visit (AMBULATORY_SURGERY_CENTER): Payer: Commercial Managed Care - PPO | Admitting: Internal Medicine

## 2023-08-18 ENCOUNTER — Encounter: Payer: Self-pay | Admitting: Internal Medicine

## 2023-08-18 VITALS — BP 109/78 | HR 73 | Temp 97.7°F | Resp 17 | Ht 67.0 in | Wt 165.0 lb

## 2023-08-18 DIAGNOSIS — K449 Diaphragmatic hernia without obstruction or gangrene: Secondary | ICD-10-CM | POA: Diagnosis present

## 2023-08-18 DIAGNOSIS — Q399 Congenital malformation of esophagus, unspecified: Secondary | ICD-10-CM

## 2023-08-18 DIAGNOSIS — K219 Gastro-esophageal reflux disease without esophagitis: Secondary | ICD-10-CM

## 2023-08-18 DIAGNOSIS — K222 Esophageal obstruction: Secondary | ICD-10-CM | POA: Diagnosis not present

## 2023-08-18 DIAGNOSIS — R1310 Dysphagia, unspecified: Secondary | ICD-10-CM

## 2023-08-18 DIAGNOSIS — K2100 Gastro-esophageal reflux disease with esophagitis, without bleeding: Secondary | ICD-10-CM

## 2023-08-18 MED ORDER — SODIUM CHLORIDE 0.9 % IV SOLN: 500.0000 mL | Freq: Once | INTRAVENOUS | Status: DC

## 2023-08-18 NOTE — Progress Notes (Signed)
 Pt's states no medical or surgical changes since previsit or office visit.

## 2023-08-18 NOTE — Progress Notes (Signed)
 Vss nad trans to pacu

## 2023-08-18 NOTE — Patient Instructions (Addendum)
 Handout provided on hiatal hernia.  Post dilation diet (see handout).  Continue present medications, including acid reflux medications (Omeprazole) daily. Please contact Dr. Marina Goodell if you begin to develop recurrent swallowing difficulties.    YOU HAD AN ENDOSCOPIC PROCEDURE TODAY AT THE [facility]:   Refer to the procedure report that was given to you for any specific questions about what was found during the examination.  If the procedure report does not answer your questions, please call your gastroenterologist to clarify.  If you requested that your care partner not be given the details of your procedure findings, then the procedure report has been included in a sealed envelope for you to review at your convenience later.  YOU SHOULD EXPECT: Some feelings of bloating in the abdomen. Passage of more gas than usual.  Walking can help get rid of the air that was put into your GI tract during the procedure and reduce the bloating. If you had a lower endoscopy (such as a colonoscopy or flexible sigmoidoscopy) you may notice spotting of blood in your stool or on the toilet paper. If you underwent a bowel prep for your procedure, you may not have a normal bowel movement for a few days.  Please Note:  You might notice some irritation and congestion in your nose or some drainage.  This is from the oxygen used during your procedure.  There is no need for concern and it should clear up in a day or so.  SYMPTOMS TO REPORT IMMEDIATELY:  Following upper endoscopy (EGD)  Vomiting of blood or coffee ground material  New chest pain or pain under the shoulder blades  Painful or persistently difficult swallowing  New shortness of breath  Fever of 100F or higher  Black, tarry-looking stools  For urgent or emergent issues, a gastroenterologist can be reached at any hour by calling [phone]. Do not use MyChart messaging for urgent concerns.    DIET:  Post Dilation diet: Clear liquids for 2  hours (until 12:00pm). Then, a Soft diet (see handout) for the rest of today. You may resume your previous diet tomorrow. Drink plenty of fluids but you should avoid alcoholic beverages for 24 hours.  ACTIVITY:  You should plan to take it easy for the rest of today and you should NOT DRIVE or use heavy machinery until tomorrow (because of the sedation medicines used during the test).    FOLLOW UP: Our staff will call the number listed on your records the next business day following your procedure.  We will call around 7:15- 8:00 am to check on you and address any questions or concerns that you may have regarding the information given to you following your procedure. If we do not reach you, we will leave a message.     If any biopsies were taken you will be contacted by phone or by letter within the next 1-3 weeks.  Please call us at [phone] if you have not heard about the biopsies in 3 weeks.    SIGNATURES/CONFIDENTIALITY: You and/or your care partner have signed paperwork which will be entered into your electronic medical record.  These signatures attest to the fact that that the information above on your After Visit Summary has been reviewed and is understood.  Full responsibility of the confidentiality of this discharge information lies with you and/or your care-partner.

## 2023-08-18 NOTE — Op Note (Signed)
 [facility] Patient Name: Molly Erickson Procedure Date: 08/18/2023 9:50 AM MRN: [id number] Endoscopist: Wilhemina Bonito. Marina Goodell , MD, [id number] Age: 73 Referring MD:  Date of Birth: May 17, 1951 Gender: Female Account #: [id number] Procedure:                Upper GI endoscopy with balloon dilation of the                            esophagus. 20 mm max Indications:              Dysphagia, Therapeutic procedure, Esophageal reflux Medicines:                Monitored Anesthesia Care Procedure:                Pre-Anesthesia Assessment:                           - Prior to the procedure, a History and Physical                            was performed, and patient medications and                            allergies were reviewed. The patient's tolerance of                            previous anesthesia was also reviewed. The risks                            and benefits of the procedure and the sedation                            options and risks were discussed with the patient.                            All questions were answered, and informed consent                            was obtained. Prior Anticoagulants: The patient has                            taken no anticoagulant or antiplatelet agents. ASA                            Grade Assessment: II - A patient with mild systemic                            disease. After reviewing the risks and benefits,                            the patient was deemed in satisfactory condition to                            undergo the procedure.  After obtaining informed consent, the endoscope was                            passed under direct vision. Throughout the                            procedure, the patient's blood pressure, pulse, and                            oxygen saturations were monitored continuously. The                            GIF [id number] #[id number] was introduced through the                             mouth, and advanced to the second part of duodenum.                            The upper GI endoscopy was accomplished without                            difficulty. The patient tolerated the procedure                            well. Scope In: Scope Out: Findings:                 The esophagus was moderately tortuous and                            foreshortened. No obvious inflammatory change.                           One benign-appearing, intrinsic moderate ringlike                            stenosis was found 35 cm from the incisors. This                            stenosis measured 1.5 cm (inner diameter). A TTS                            dilator was passed through the scope. Dilation with                            an 18-19-20 mm balloon dilator was performed to 20                            mm. Mucosal ring disruption was appreciated                           The stomach revealed a moderate hiatal hernia but                            was  otherwise normal.                           The examined duodenum was normal.                           The cardia and gastric fundus were normal on                            retroflexion. Complications:            No immediate complications. Estimated Blood Loss:     Estimated blood loss: none. Impression:               1. GERD complicated by peptic stricture status post                            dilation. Recommendation:           - Patient has a contact number available for                            emergencies. The signs and symptoms of potential                            delayed complications were discussed with the                            patient. Return to normal activities tomorrow.                            Written discharge instructions were provided to the                            patient.                           - Post dilation diet.                           - Continue present medications, including acid                             reflux medications daily.                           - PLEASE CONTACT DR. Charlotta Lapaglia IF YOU BEGIN TO DEVELOP                            RECURRENT SWALLOWING DIFFICULTIES [name] N. Marina Goodell, MD 08/18/2023 10:10:56 AM This report has been signed electronically.

## 2023-08-18 NOTE — Progress Notes (Signed)
 HISTORY OF PRESENT ILLNESS:  Molly Erickson is a 73 y.o. female with a history of GERD complicated by peptic stricture.  Now for EGD with dilation for dysphagia  REVIEW OF SYSTEMS:  All non-GI ROS negative except for  Past Medical History:  Diagnosis Date   Abscess of finger 11/15/2010   Allergy    Arthritis    Blood transfusion without reported diagnosis    Bronchitis    Cancer (HCC) 06/2011   Basal cell, Dr Brooke Dare , [place]   CIN I (cervical intraepithelial neoplasia I)    COVID-19 virus infection    Fever blister    GERD (gastroesophageal reflux disease)    on meds   Granulomatosis with polyangiitis (HCC)    Hashimoto's thyroiditis    on 100 mcg synthroid   Hematuria    Hyperlipidemia    diet controlled   Hypothyroidism    on meds   Neck pain 12/31/2014   Osteopenia    Osteoporosis    Relapsing polychondritis    Thyroid activity decreased    on meds   Wegener's syndrome     Past Surgical History:  Procedure Laterality Date   BREAST BIOPSY     Papilloma-Intraductal   BREAST LUMPECTOMY     benign   COLONOSCOPY  2014   DB-MAC-moviprep(good)-HPP   COLPOSCOPY     ESOPHAGOGASTRODUODENOSCOPY (EGD) WITH ESOPHAGEAL DILATION  05/01/2021   Dr.Lorijean Husser   LUNG BIOPSY     UPPER GASTROINTESTINAL ENDOSCOPY  07/2020   JP-MAC-moderate stenosis/esophagitis   UPPER GASTROINTESTINAL ENDOSCOPY  2023   JP-dilation 18-20 mm   WISDOM TOOTH EXTRACTION      Social History Molly Erickson  reports that she has never smoked. She has never used smokeless tobacco. She reports current alcohol use of about 4.0 standard drinks of alcohol per week. She reports that she does not use drugs.  family history includes Breast cancer in her maternal aunt and mother; Coronary artery disease in her father; Diabetes in her daughter; Heart disease in her father; Hypertension in her father; Leukemia in her mother; Prostate cancer in her father; Skin cancer in her father.  Allergies  Allergen  Reactions   Augmentin [Amoxicillin-Pot Clavulanate] Diarrhea    Pt states she can take Amoxicillin by itself with no issues       PHYSICAL EXAMINATION: Vital signs: BP 137/82   Pulse 64   Temp 97.7 F (36.5 C) (Temporal)   Resp 10   Ht 5\' 7"  (1.702 m)   Wt 165 lb (74.8 kg)   SpO2 100%   BMI 25.84 kg/m  General: Well-developed, well-nourished, no acute distress HEENT: Sclerae are anicteric, conjunctiva pink. Oral mucosa intact Lungs: Clear Heart: Regular Abdomen: soft, nontender, nondistended, no obvious ascites, no peritoneal signs, normal bowel sounds. No organomegaly. Extremities: No edema Psychiatric: alert and oriented x3. Cooperative     ASSESSMENT:  GERD complicated by peptic stricture with associated dysphagia   PLAN:  EGD with dilation

## 2023-08-18 NOTE — Progress Notes (Signed)
 Called to room to assist during endoscopic procedure.  Patient ID and intended procedure confirmed with present staff. Received instructions for my participation in the procedure from the performing physician.

## 2023-08-19 ENCOUNTER — Telehealth: Payer: Self-pay

## 2023-08-19 NOTE — Telephone Encounter (Signed)
  Follow up Call-     08/18/2023    8:25 AM 06/08/2023    9:29 AM 04/27/2023    9:51 AM 10/24/2021   12:55 PM 05/01/2021    9:32 AM  Call back number  Post procedure Call Back phone  # [phone]  Permission to leave phone message Yes Yes Yes Yes Yes     Patient questions:  Do you have a fever, pain , or abdominal swelling? No. Pain Score  0 *  Have you tolerated food without any problems? Yes.    Have you been able to return to your normal activities? Yes.    Do you have any questions about your discharge instructions: Diet   No. Medications  No. Follow up visit  No.  Do you have questions or concerns about your Care? No.  Actions: * If pain score is 4 or above: No action needed, pain <4.

## 2023-08-26 ENCOUNTER — Telehealth: Payer: Self-pay

## 2023-08-26 NOTE — Telephone Encounter (Signed)
 Copied from CRM [id number]. Topic: Referral - Status >> Aug 26, 2023  2:28 PM Eunice Blase wrote: Reason for CRM: Received call from pt regarding referral [phone] for ECHOCARDIOGRAM COMPLETE, pt stated no one has called her to schedule appt. Please call pt to provided phone number of Cardiology.

## 2023-09-17 ENCOUNTER — Ambulatory Visit (INDEPENDENT_AMBULATORY_CARE_PROVIDER_SITE_OTHER)

## 2023-09-17 ENCOUNTER — Encounter: Payer: Self-pay | Admitting: Internal Medicine

## 2023-09-17 DIAGNOSIS — I517 Cardiomegaly: Secondary | ICD-10-CM | POA: Diagnosis not present

## 2023-09-17 LAB — ECHOCARDIOGRAM COMPLETE
Area-P 1/2: 3.99 cm2
MV M vel: 4.41 m/s
MV Peak grad: 77.8 mmHg
Radius: 0.4 cm
S' Lateral: 2.8 cm

## 2023-11-30 DIAGNOSIS — L821 Other seborrheic keratosis: Secondary | ICD-10-CM | POA: Diagnosis not present

## 2023-11-30 DIAGNOSIS — I8312 Varicose veins of left lower extremity with inflammation: Secondary | ICD-10-CM | POA: Diagnosis not present

## 2023-11-30 DIAGNOSIS — L738 Other specified follicular disorders: Secondary | ICD-10-CM | POA: Diagnosis not present

## 2023-11-30 DIAGNOSIS — Z85828 Personal history of other malignant neoplasm of skin: Secondary | ICD-10-CM | POA: Diagnosis not present

## 2023-11-30 DIAGNOSIS — L570 Actinic keratosis: Secondary | ICD-10-CM | POA: Diagnosis not present

## 2023-12-07 DIAGNOSIS — N958 Other specified menopausal and perimenopausal disorders: Secondary | ICD-10-CM | POA: Diagnosis not present

## 2023-12-07 DIAGNOSIS — M816 Localized osteoporosis [Lequesne]: Secondary | ICD-10-CM | POA: Diagnosis not present

## 2023-12-07 LAB — HM DEXA SCAN

## 2024-03-13 ENCOUNTER — Encounter: Payer: Self-pay | Admitting: Internal Medicine

## 2024-03-13 NOTE — Progress Notes (Unsigned)
    Subjective:    Patient ID: Molly Erickson, female    DOB: Jan 16, 1951, 73 y.o.   MRN: [id number]      HPI Molly Erickson is here for No chief complaint on file.        Medications and allergies reviewed with patient and updated if appropriate.  Current Outpatient Medications on File Prior to Visit  Medication Sig Dispense Refill   Cholecalciferol (VITAMIN D3) 125 MCG (5000 UT) CAPS Take by mouth daily.     liothyronine (CYTOMEL) 5 MCG tablet Take 15 mcg by mouth daily. Patient takes 5mg      omeprazole  (PRILOSEC) 40 MG capsule Take 1 capsule (40 mg total) by mouth in the morning and at bedtime. 180 capsule 4   OVER THE COUNTER MEDICATION Probiotics     raloxifene (EVISTA) 60 MG tablet Take 60 mg by mouth daily.     SYNTHROID  100 MCG tablet Take 100 mcg by mouth every morning.     No current facility-administered medications on file prior to visit.    Review of Systems     Objective:  There were no vitals filed for this visit. BP Readings from Last 3 Encounters:  08/18/23 109/78  08/17/23 100/82  06/08/23 (!) 141/69   Wt Readings from Last 3 Encounters:  08/18/23 165 lb (74.8 kg)  08/17/23 164 lb (74.4 kg)  08/04/23 165 lb (74.8 kg)   There is no height or weight on file to calculate BMI.    Physical Exam         Assessment & Plan:    See Problem List for Assessment and Plan of chronic medical problems.

## 2024-03-14 ENCOUNTER — Ambulatory Visit (INDEPENDENT_AMBULATORY_CARE_PROVIDER_SITE_OTHER): Admitting: Internal Medicine

## 2024-03-14 VITALS — BP 106/72 | HR 59 | Temp 98.0°F | Ht 67.0 in | Wt 167.0 lb

## 2024-03-14 DIAGNOSIS — I8001 Phlebitis and thrombophlebitis of superficial vessels of right lower extremity: Secondary | ICD-10-CM | POA: Diagnosis not present

## 2024-03-14 MED ORDER — MELOXICAM 15 MG PO TABS: 15.0000 mg | ORAL_TABLET | Freq: Every day | ORAL | 0 refills | Status: DC

## 2024-03-14 NOTE — Patient Instructions (Addendum)
    An US  of your right leg was ordered.     Medications changes include :   Meloxicam  15 mg daily - take with food.   Take your omeprazole  daily when on this medication.   Use heating pad on the area a few times a day.

## 2024-03-14 NOTE — Assessment & Plan Note (Signed)
 Acute Symptoms started 4 days ago Exam consistent with superficial thrombophlebitis Stat ultrasound which will likely be done tomorrow to rule out any DVT Start meloxicam  15 mg daily-take with food Use heating pad to area multiple times a day She can use compression-Will do an Ace bandage wrapped around her leg in this area-I do not think compression socks would work well in this area

## 2024-03-15 ENCOUNTER — Ambulatory Visit (HOSPITAL_COMMUNITY)
Admission: RE | Admit: 2024-03-15 | Discharge: 2024-03-15 | Disposition: A | Discharge status: Home / Self Care | Source: Ambulatory Visit | Attending: Internal Medicine | Admitting: Internal Medicine

## 2024-03-15 ENCOUNTER — Ambulatory Visit: Payer: Self-pay | Admitting: Internal Medicine

## 2024-03-15 DIAGNOSIS — I8001 Phlebitis and thrombophlebitis of superficial vessels of right lower extremity: Secondary | ICD-10-CM | POA: Diagnosis not present

## 2024-04-05 DIAGNOSIS — E039 Hypothyroidism, unspecified: Secondary | ICD-10-CM | POA: Diagnosis not present

## 2024-04-05 DIAGNOSIS — R5382 Chronic fatigue, unspecified: Secondary | ICD-10-CM | POA: Diagnosis not present

## 2024-04-05 DIAGNOSIS — E559 Vitamin D deficiency, unspecified: Secondary | ICD-10-CM | POA: Diagnosis not present

## 2024-04-05 DIAGNOSIS — R799 Abnormal finding of blood chemistry, unspecified: Secondary | ICD-10-CM | POA: Diagnosis not present

## 2024-04-05 DIAGNOSIS — R5383 Other fatigue: Secondary | ICD-10-CM | POA: Diagnosis not present

## 2024-04-05 DIAGNOSIS — M810 Age-related osteoporosis without current pathological fracture: Secondary | ICD-10-CM | POA: Diagnosis not present

## 2024-04-11 ENCOUNTER — Other Ambulatory Visit: Payer: Self-pay

## 2024-04-11 DIAGNOSIS — I872 Venous insufficiency (chronic) (peripheral): Secondary | ICD-10-CM

## 2024-04-21 ENCOUNTER — Ambulatory Visit (HOSPITAL_COMMUNITY)
Admission: RE | Admit: 2024-04-21 | Discharge: 2024-04-21 | Disposition: A | Discharge status: Home / Self Care | Source: Ambulatory Visit | Attending: Vascular Surgery | Admitting: Vascular Surgery

## 2024-04-21 DIAGNOSIS — I872 Venous insufficiency (chronic) (peripheral): Secondary | ICD-10-CM | POA: Insufficient documentation

## 2024-05-05 DIAGNOSIS — H524 Presbyopia: Secondary | ICD-10-CM | POA: Diagnosis not present

## 2024-05-17 DIAGNOSIS — Z01419 Encounter for gynecological examination (general) (routine) without abnormal findings: Secondary | ICD-10-CM | POA: Diagnosis not present

## 2024-05-17 DIAGNOSIS — Z6827 Body mass index (BMI) 27.0-27.9, adult: Secondary | ICD-10-CM | POA: Diagnosis not present

## 2024-05-17 DIAGNOSIS — Z1231 Encounter for screening mammogram for malignant neoplasm of breast: Secondary | ICD-10-CM | POA: Diagnosis not present

## 2024-05-17 LAB — HM MAMMOGRAPHY

## 2024-06-05 ENCOUNTER — Encounter: Payer: Self-pay | Admitting: Internal Medicine

## 2024-06-05 NOTE — Patient Instructions (Signed)
° °  Ms. Launer,  Thank you for taking the time for your Medicare Wellness Visit. I appreciate your continued commitment to your health goals. Please review the care plan we discussed, and feel free to reach out if I can assist you further.  Please note that Annual Wellness Visits do not include a physical exam. Some assessments may be limited, especially if the visit was conducted virtually. If needed, we may recommend an in-person follow-up with your provider.  Ongoing Care Seeing your primary care provider every year helps us  monitor your health and provide consistent, personalized care.   Referrals If a referral was made during today's visit and you haven't received any updates within two weeks, please contact the referred provider directly to check on the status.  Recommended Screenings:  Health Maintenance  Topic Date Due   Medicare Annual Wellness Visit  Never done   Pneumococcal Vaccine for age over 64 (1 of 2 - PCV) Never done   Zoster (Shingles) Vaccine (1 of 2) Never done   Osteoporosis screening with Bone Density Scan  Never done   Breast Cancer Screening  12/03/2016   DTaP/Tdap/Td vaccine (2 - Tdap) 01/15/2020   COVID-19 Vaccine (6 - 2025-26 season) 02/22/2024   Flu Shot  Completed   Hepatitis C Screening  Completed   Meningitis B Vaccine  Aged Out   Colon Cancer Screening  Discontinued        No data to display          Vision: Annual vision screenings are recommended for early detection of glaucoma, cataracts, and diabetic retinopathy. These exams can also reveal signs of chronic conditions such as diabetes and high blood pressure.  Dental: Annual dental screenings help detect early signs of oral cancer, gum disease, and other conditions linked to overall health, including heart disease and diabetes.  Please see the attached documents for additional preventive care recommendations.       Blood work was ordered.       Medications changes include :    None    A referral was ordered and someone will call you to schedule an appointment.     Return in about 1 year (around 06/06/2025) for Physical Exam.

## 2024-06-05 NOTE — Progress Notes (Unsigned)
 Annual Wellness Visit - Welcome to [facility] Visit and Physical Exam   Carla - fall, dep, EKG, vision, SDOH Me - memory, functional status, exercise status, hearing/vision, ? alcohol    Patient: Molly Erickson, Female    DOB: 10-31-1950, 73 y.o.   MRN: [id number]  Subjective  No chief complaint on file.   Molly Erickson is a 73 y.o. female who presents today for her Annual Wellness Visit. She reports consuming a {diet types:17450} diet. {Exercise:19826} She generally feels {well/fairly well/poorly:18703}. She reports sleeping {well/fairly well/poorly:18703}. She {does/does not:200015} have additional problems to discuss today.   HPI  {VISON DENTAL STD PSA (Optional):27386}   Patient Active Problem List   Diagnosis Date Noted   Low vitamin B12 level 06/05/2024   Prediabetes 06/05/2024   Thrombophlebitis of superficial veins of right lower extremity 03/14/2024   Cardiomegaly 08/17/2023   CAD in native artery 08/17/2023   CAP (community acquired pneumonia) 08/17/2023   Skin sensation disturbance 02/06/2023   Acute pain of right knee 12/20/2020   Raynaud phenomenon 12/20/2020   Esophageal stricture 09/19/2020   Flank pain 05/20/2019   Neck pain 12/31/2014   CIN I (cervical intraepithelial neoplasia I)    Relapsing polychondritis    Fever blister    Hematuria    SNORING, HX OF 08/09/2010   BREAST BIOPSY, HX OF 01/14/2010   Hypothyroidism 10/16/2008   Hypercholesterolemia 10/16/2008   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/16/2008   GERD 10/16/2008   Osteoporosis 10/16/2008   Past Medical History:  Diagnosis Date   Abscess of finger 11/15/2010   Allergy    Arthritis    Blood transfusion without reported diagnosis    Bronchitis    Cancer (HCC) 06/2011   Basal cell, Dr Myrna , [place]   CIN I (cervical intraepithelial neoplasia I)    COVID-19 virus infection    Fever blister    GERD (gastroesophageal reflux disease)    on meds    Granulomatosis with polyangiitis (HCC)    Hashimoto's thyroiditis    on 100 mcg synthroid    Hematuria    Hyperlipidemia    diet controlled   Hypothyroidism    on meds   Neck pain 12/31/2014   Osteopenia    Osteoporosis    Relapsing polychondritis    Thyroid  activity decreased    on meds   Wegener's syndrome    Social History[1] Social History   Socioeconomic History   Marital status: Married    Spouse name: Not on file   Number of children: Not on file   Years of education: Not on file   Highest education level: Not on file  Occupational History   Not on file  Tobacco Use   Smoking status: Never   Smokeless tobacco: Never  Vaping Use   Vaping status: Never Used  Substance and Sexual Activity   Alcohol use: Yes    Alcohol/week: 4.0 standard drinks of alcohol    Types: 4 Standard drinks or equivalent per week    Comment: socially   Drug use: No   Sexual activity: Yes    Birth control/protection: Post-menopausal  Other Topics Concern   Not on file  Social History Narrative   Not on file   Social Drivers of Health   Tobacco Use: Low Risk (06/05/2024)   Patient History    Smoking Tobacco Use: Never    Smokeless Tobacco Use: Never    Passive Exposure: Not on file  Financial Resource Strain: Not  on file  Food Insecurity: Not on file  Transportation Needs: Not on file  Physical Activity: Not on file  Stress: Not on file  Social Connections: Not on file  Intimate Partner Violence: Not on file  Depression (PHQ2-9): Low Risk (03/14/2024)   Depression (PHQ2-9)    PHQ-2 Score: 0  Alcohol Screen: Not on file  Housing: Not on file  Utilities: Not on file  Health Literacy: Not on file      Medications: Show/hide medication list[2]  Allergies[3]  Patient Care Team: Geofm Glade PARAS, MD as PCP - General (Internal Medicine) Jordan, Amy, MD as Consulting Physician (Dermatology) Abran Norleen SAILOR, MD as Consulting Physician (Gastroenterology) Mai Lynwood FALCON, MD as  Consulting Physician (Rheumatology)  ROS      Objective  There were no vitals taken for this visit. {Vitals History (Optional):23777}  Physical Exam  Most recent depression screenings:    03/14/2024    3:33 PM 08/17/2023    7:55 AM  PHQ 2/9 Scores  PHQ - 2 Score 0 0  PHQ- 9 Score 0       Data saved with a previous flowsheet row definition    Fall Screening Falls in the past year?: 0 Number of falls in past year: 0 Was there an injury with Fall?: 0 Fall Risk Category Calculator: 0 Patient Fall Risk Level: Low Fall Risk  Fall Risk Patient at Risk for Falls Due to: No Fall Risks Fall risk Follow up: Falls evaluation completed    Vision/Hearing Screen: No results found.  {Labs (Optional):23779}  No results found for any visits on 06/06/24.    Assessment & Plan   Annual wellness visit done today including the all of the following: Reviewed patient's Family and Medical History Reviewed and updated list of patient's medical providers Assessment of cognitive impairment was done Assessed patient's functional ability Established a written schedule for health screening services Health Risk Assessent Completed and Reviewed  Exercise Activities and Dietary recommendations  Goals   None     Immunization History  Administered Date(s) Administered   INFLUENZA, HIGH DOSE SEASONAL PF 03/10/2024   Influenza Whole 03/26/2013   Influenza-Unspecified 03/17/2014, 04/04/2015   PFIZER(Purple Top)SARS-COV-2 Vaccination 07/14/2019, 08/01/2019   Td 01/14/2010   Unspecified SARS-COV-2 Vaccination 07/14/2019, 08/01/2019, 02/16/2020    Health Maintenance  Topic Date Due   Medicare Annual Wellness (AWV)  Never done   Pneumococcal Vaccine: 50+ Years (1 of 2 - PCV) Never done   Zoster Vaccines- Shingrix (1 of 2) Never done   Bone Density Scan  Never done   Mammogram  12/03/2016   DTaP/Tdap/Td (2 - Tdap) 01/15/2020   COVID-19 Vaccine (6 - 2025-26 season) 02/22/2024    Influenza Vaccine  Completed   Hepatitis C Screening  Completed   Meningococcal B Vaccine  Aged Out   Colonoscopy  Discontinued     Discussed health benefits of physical activity, and encouraged her to engage in regular exercise appropriate for her age and condition.    Problem List Items Addressed This Visit       Cardiovascular and Mediastinum   CAD in native artery   Granulomatosis with polyangiitis (HCC) - Dr Delmar   Thrombophlebitis of superficial veins of right lower extremity     Digestive   GERD     Endocrine   Hypothyroidism     Musculoskeletal and Integument   Osteoporosis   Relapsing polychondritis     Other   Low vitamin B12 level   Prediabetes  Other Visit Diagnoses       Encounter for Medicare annual wellness exam    -  Primary     Preventative health care           Return in about 1 year (around 06/06/2025) for Physical Exam.     Glade JINNY Hope, MD       [1]  Social History Tobacco Use   Smoking status: Never   Smokeless tobacco: Never  Vaping Use   Vaping status: Never Used  Substance Use Topics   Alcohol use: Yes    Alcohol/week: 4.0 standard drinks of alcohol    Types: 4 Standard drinks or equivalent per week    Comment: socially   Drug use: No  [2]  Outpatient Medications Prior to Visit  Medication Sig   Cholecalciferol (VITAMIN D3) 125 MCG (5000 UT) CAPS Take by mouth daily.   liothyronine (CYTOMEL) 5 MCG tablet Take 15 mcg by mouth daily. Patient takes 5mg    meloxicam  (MOBIC ) 15 MG tablet Take 1 tablet (15 mg total) by mouth daily.   omeprazole  (PRILOSEC) 40 MG capsule Take 1 capsule (40 mg total) by mouth in the morning and at bedtime.   OVER THE COUNTER MEDICATION Probiotics   raloxifene (EVISTA) 60 MG tablet Take 60 mg by mouth daily.   SYNTHROID  100 MCG tablet Take 100 mcg by mouth every morning.   No facility-administered medications prior to visit.  [3] No Active Allergies

## 2024-06-06 ENCOUNTER — Ambulatory Visit: Admitting: Internal Medicine

## 2024-06-06 ENCOUNTER — Ambulatory Visit: Attending: Internal Medicine

## 2024-06-06 VITALS — BP 130/78 | HR 75 | Temp 98.9°F | Ht 66.0 in | Wt 169.0 lb

## 2024-06-06 DIAGNOSIS — I2510 Atherosclerotic heart disease of native coronary artery without angina pectoris: Secondary | ICD-10-CM | POA: Diagnosis not present

## 2024-06-06 DIAGNOSIS — R7989 Other specified abnormal findings of blood chemistry: Secondary | ICD-10-CM | POA: Insufficient documentation

## 2024-06-06 DIAGNOSIS — I8001 Phlebitis and thrombophlebitis of superficial vessels of right lower extremity: Secondary | ICD-10-CM

## 2024-06-06 DIAGNOSIS — R350 Frequency of micturition: Secondary | ICD-10-CM

## 2024-06-06 DIAGNOSIS — I4819 Other persistent atrial fibrillation: Secondary | ICD-10-CM | POA: Insufficient documentation

## 2024-06-06 DIAGNOSIS — R7303 Prediabetes: Secondary | ICD-10-CM | POA: Insufficient documentation

## 2024-06-06 DIAGNOSIS — K219 Gastro-esophageal reflux disease without esophagitis: Secondary | ICD-10-CM

## 2024-06-06 DIAGNOSIS — I4891 Unspecified atrial fibrillation: Secondary | ICD-10-CM

## 2024-06-06 DIAGNOSIS — M941 Relapsing polychondritis: Secondary | ICD-10-CM

## 2024-06-06 DIAGNOSIS — E7800 Pure hypercholesterolemia, unspecified: Secondary | ICD-10-CM

## 2024-06-06 DIAGNOSIS — E038 Other specified hypothyroidism: Secondary | ICD-10-CM

## 2024-06-06 DIAGNOSIS — M810 Age-related osteoporosis without current pathological fracture: Secondary | ICD-10-CM

## 2024-06-06 DIAGNOSIS — M3130 Wegener's granulomatosis without renal involvement: Secondary | ICD-10-CM

## 2024-06-06 DIAGNOSIS — Z0000 Encounter for general adult medical examination without abnormal findings: Secondary | ICD-10-CM | POA: Diagnosis not present

## 2024-06-06 MED ORDER — APIXABAN 5 MG PO TABS: 5.0000 mg | ORAL_TABLET | Freq: Two times a day (BID) | ORAL | 2 refills | Status: AC

## 2024-06-06 NOTE — Assessment & Plan Note (Signed)
 Chronic Has mild CAD Regular exercise and healthy diet encouraged Check lipid panel  Continue lifestyle control-we have discussed that she should ideally be on a cholesterol medication Will check lipid panel and then discuss starting a statin

## 2024-06-06 NOTE — Assessment & Plan Note (Signed)
 Chronic Management per naturolopath-states that her thyroid  was overactive, but medications have been adjusted and were better when checked a couple of months ago Currently on Synthroid  88 mcg daily, Cytomel 15 mcg daily Will check TSH, free T3-stressed the importance of her thyroid  being in the normal range because of it affecting her heart rhythm

## 2024-06-06 NOTE — Assessment & Plan Note (Signed)
 Following with rheumatology Currently in remission Will check ESR, ANCA profile

## 2024-06-06 NOTE — Assessment & Plan Note (Signed)
 Subacute Still has blood clot present It does not bother greater-not painful Has seen vascular and has a follow-up next month Will be starting a blood thinner which may help

## 2024-06-06 NOTE — Assessment & Plan Note (Signed)
 CT CAC August 2024 with CAC score of 46.4 Mild disease We have discussed statins in the past and she was hesitant to start 1 Recheck cholesterol today Can consider repeating CAC in a few years Continue regular exercise, healthy diet

## 2024-06-06 NOTE — Assessment & Plan Note (Signed)
 Chronic Management per GYN-on Evista Continue regular exercise Continue taking calcium, vitamin d  Vitamin D  level just checked by her natural-in the normal range

## 2024-06-06 NOTE — Assessment & Plan Note (Signed)
 Chronic Lab Results  Component Value Date   HGBA1C 5.8 10/18/2008   Check a1c Low sugar / carb diet Stressed regular exercise

## 2024-06-06 NOTE — Progress Notes (Unsigned)
 EP to read.

## 2024-06-06 NOTE — Assessment & Plan Note (Signed)
Following with Dr Amil Amen No episodes in over one year

## 2024-06-06 NOTE — Assessment & Plan Note (Signed)
 New EKG today shows atrial fibrillation at 63 bpm Difficult to see the P waves on the EKG-A-fib versus sinus arrhythmia Referral to A-fib clinic Holter monitor ordered for 7 days Eliquis  5 mg twice daily started Check basic blood work today including TFTs, CBC, CMP

## 2024-06-06 NOTE — Assessment & Plan Note (Signed)
 Chronic Recently checked by her naturopath It was in the normal range

## 2024-06-06 NOTE — Assessment & Plan Note (Signed)
Chronic Following with GI GERD controlled Continue omeprazole 40 mg twice daily

## 2024-06-07 ENCOUNTER — Other Ambulatory Visit

## 2024-06-07 DIAGNOSIS — R7303 Prediabetes: Secondary | ICD-10-CM

## 2024-06-07 DIAGNOSIS — R350 Frequency of micturition: Secondary | ICD-10-CM

## 2024-06-07 DIAGNOSIS — I4891 Unspecified atrial fibrillation: Secondary | ICD-10-CM | POA: Diagnosis not present

## 2024-06-07 DIAGNOSIS — M3130 Wegener's granulomatosis without renal involvement: Secondary | ICD-10-CM | POA: Diagnosis not present

## 2024-06-07 DIAGNOSIS — E038 Other specified hypothyroidism: Secondary | ICD-10-CM

## 2024-06-07 DIAGNOSIS — I2510 Atherosclerotic heart disease of native coronary artery without angina pectoris: Secondary | ICD-10-CM

## 2024-06-07 NOTE — Addendum Note (Signed)
 Addended by: MAISIE MILLING D on: 06/07/2024 09:38 AM   Modules accepted: Orders

## 2024-06-08 LAB — CBC
Hematocrit: 38.2 % (ref 34.0–46.6)
Hemoglobin: 12.5 g/dL (ref 11.1–15.9)
MCH: 29.8 pg (ref 26.6–33.0)
MCHC: 32.7 g/dL (ref 31.5–35.7)
MCV: 91 fL (ref 79–97)
Platelets: 278 x10E3/uL (ref 150–450)
RBC: 4.2 x10E6/uL (ref 3.77–5.28)
RDW: 12.9 % (ref 11.7–15.4)
WBC: 7.6 x10E3/uL (ref 3.4–10.8)

## 2024-06-08 LAB — LIPID PANEL
Chol/HDL Ratio: 3.4 ratio (ref 0.0–4.4)
Cholesterol, Total: 181 mg/dL (ref 100–199)
HDL: 53 mg/dL (ref >39)
LDL Chol Calc (NIH): 111 mg/dL — ABNORMAL HIGH (ref 0–99)
Triglycerides: 96 mg/dL (ref 0–149)
VLDL Cholesterol Cal: 17 mg/dL (ref 5–40)

## 2024-06-08 LAB — SEDIMENTATION RATE: Sed Rate: 14 mm/h (ref 0–40)

## 2024-06-08 LAB — COMPREHENSIVE METABOLIC PANEL WITH GFR
ALT: 10 IU/L (ref 0–32)
AST: 15 IU/L (ref 0–40)
Albumin: 3.9 g/dL (ref 3.8–4.8)
Alkaline Phosphatase: 80 IU/L (ref 49–135)
BUN/Creatinine Ratio: 16 (ref 12–28)
BUN: 15 mg/dL (ref 8–27)
Bilirubin Total: 0.5 mg/dL (ref 0.0–1.2)
CO2: 23 mmol/L (ref 20–29)
Calcium: 9.3 mg/dL (ref 8.7–10.3)
Chloride: 103 mmol/L (ref 96–106)
Creatinine, Ser: 0.91 mg/dL (ref 0.57–1.00)
Globulin, Total: 2.6 g/dL (ref 1.5–4.5)
Glucose: 97 mg/dL (ref 70–99)
Potassium: 4.2 mmol/L (ref 3.5–5.2)
Sodium: 139 mmol/L (ref 134–144)
Total Protein: 6.5 g/dL (ref 6.0–8.5)
eGFR: 66 mL/min/1.73 (ref >59)

## 2024-06-08 LAB — URINALYSIS, ROUTINE W REFLEX MICROSCOPIC
Bilirubin, UA: NEGATIVE
Glucose, UA: NEGATIVE
Ketones, UA: NEGATIVE
Nitrite, UA: NEGATIVE
Protein,UA: NEGATIVE
RBC, UA: NEGATIVE
Specific Gravity, UA: 1.009 (ref 1.005–1.030)
Urobilinogen, Ur: 0.2 mg/dL (ref 0.2–1.0)
pH, UA: 5 (ref 5.0–7.5)

## 2024-06-08 LAB — MICROSCOPIC EXAMINATION
Bacteria, UA: NONE SEEN
Casts: NONE SEEN /LPF
RBC, Urine: NONE SEEN /HPF (ref 0–2)

## 2024-06-08 LAB — TSH: TSH: 0.483 u[IU]/mL (ref 0.450–4.500)

## 2024-06-08 LAB — T3, FREE: T3, Free: 2.1 pg/mL (ref 2.0–4.4)

## 2024-06-08 LAB — HEMOGLOBIN A1C
Est. average glucose Bld gHb Est-mCnc: 114 mg/dL
Hgb A1c MFr Bld: 5.6 % (ref 4.8–5.6)

## 2024-06-09 ENCOUNTER — Ambulatory Visit: Payer: Self-pay | Admitting: Internal Medicine

## 2024-06-09 LAB — ANCA PROFILE
Anti-MPO Antibodies: <0.2 U (ref 0.0–0.9)
Anti-PR3 Antibodies: >8 U — ABNORMAL HIGH (ref 0.0–0.9)
Atypical pANCA: <1:20 {titer}
C-ANCA: <1:20 {titer}
P-ANCA: <1:20 {titer}

## 2024-06-20 ENCOUNTER — Ambulatory Visit (HOSPITAL_COMMUNITY)
Admission: RE | Admit: 2024-06-20 | Discharge: 2024-06-20 | Disposition: A | Discharge status: Home / Self Care | Source: Ambulatory Visit | Attending: Physician Assistant | Admitting: Physician Assistant

## 2024-06-20 VITALS — BP 140/70 | HR 55 | Ht 66.0 in | Wt 169.8 lb

## 2024-06-20 DIAGNOSIS — I4891 Unspecified atrial fibrillation: Secondary | ICD-10-CM | POA: Diagnosis not present

## 2024-06-20 DIAGNOSIS — D6869 Other thrombophilia: Secondary | ICD-10-CM | POA: Diagnosis not present

## 2024-06-20 DIAGNOSIS — I4819 Other persistent atrial fibrillation: Secondary | ICD-10-CM | POA: Diagnosis not present

## 2024-06-20 NOTE — H&P (View-Only) (Signed)
 "   Primary Care Physician: Geofm Glade PARAS, MD Primary Cardiologist: None Electrophysiologist: None  Referring Physician: Dr Geofm Lauraine KATHEE Molly is a 73 y.o. female with a history of CAD, granulomatosis with polyangiitis, hypothyroidism HLD, atrial fibrillation who presents for consultation in the [facility].  The patient was initially diagnosed with atrial fibrillation 06/06/24 after presenting for a routine visit with her PCP. ECG showed rate controlled afib. A monitor was ordered which is not yet finalized. Patient is on Eliquis  for stroke prevention.   Today, patient remains in rate controlled afib today. In hindsight, she has had intermittent lightheadedness for the past 2 years. She occasionally drinks alcohol. She denies significant snoring or daytime somnolence.   Today, she denies symptoms of palpitations, chest pain, shortness of breath, orthopnea, PND, lower extremity edema, presyncope, syncope, snoring, daytime somnolence, bleeding, or neurologic sequela. The patient is tolerating medications without difficulties and is otherwise without complaint today.    Atrial Fibrillation Risk Factors:  she does not have symptoms or diagnosis of sleep apnea. she does not have a history of rheumatic fever. she does have a history of alcohol use. 3-4 drinks per week. The patient does have a history of early familial atrial fibrillation or other arrhythmias. Brother has afib.   Atrial Fibrillation Management history:  Previous antiarrhythmic drugs: none Previous cardioversions: none Previous ablations: none Anticoagulation history: Eliquis   ROS- All systems are reviewed and negative except as per the HPI above.  Past Medical History:  Diagnosis Date   Abscess of finger 11/15/2010   Allergy    Arthritis    Blood transfusion without reported diagnosis    Bronchitis    Cancer (HCC) 06/2011   Basal cell, Dr Myrna , [place]   CIN I (cervical  intraepithelial neoplasia I)    COVID-19 virus infection    Fever blister    GERD (gastroesophageal reflux disease)    on meds   Granulomatosis with polyangiitis (HCC)    Hashimoto's thyroiditis    on 100 mcg synthroid    Hematuria    Hyperlipidemia    diet controlled   Hypothyroidism    on meds   Neck pain 12/31/2014   Osteopenia    Osteoporosis    Relapsing polychondritis    Thyroid  activity decreased    on meds   Wegener's syndrome     Current Outpatient Medications  Medication Sig Dispense Refill   apixaban  (ELIQUIS ) 5 MG TABS tablet Take 1 tablet (5 mg total) by mouth 2 (two) times daily. 60 tablet 2   Cholecalciferol (VITAMIN D3) 125 MCG (5000 UT) CAPS Take by mouth daily.     levothyroxine  (SYNTHROID ) 88 MCG tablet Take 88 mcg by mouth daily before breakfast.     liothyronine (CYTOMEL) 5 MCG tablet Take 15 mcg by mouth daily. Patient takes 5mg  (Patient taking differently: Take 15 mcg by mouth daily.)     omeprazole  (PRILOSEC) 40 MG capsule Take 1 capsule (40 mg total) by mouth in the morning and at bedtime. 180 capsule 4   OVER THE COUNTER MEDICATION Probiotics (Patient taking differently: Take 1 tablet by mouth as needed. Probiotics)     raloxifene (EVISTA) 60 MG tablet Take 60 mg by mouth daily.     No current facility-administered medications for this encounter.    Physical Exam: BP (!) 140/70   Pulse (!) 55   Ht 5' 6 (1.676 m)   Wt 77 kg   BMI 27.41 kg/m   GEN:  Well nourished, well developed in no acute distress CARDIAC: Irregularly irregular rate and rhythm, no murmurs, rubs, gallops RESPIRATORY:  Clear to auscultation without rales, wheezing or rhonchi  ABDOMEN: Soft, non-tender, non-distended EXTREMITIES:  No edema; No deformity   Wt Readings from Last 3 Encounters:  06/20/24 77 kg  06/06/24 76.7 kg  03/14/24 75.8 kg     EKG Interpretation Date/Time:  Monday June 20 2024 08:43:05 EST Ventricular Rate:  55 PR Interval:    QRS  Duration:  74 QT Interval:  414 QTC Calculation: 396 R Axis:   10  Text Interpretation: Atrial fibrillation with slow ventricular response Cannot rule out Anteroseptal infarct , age undetermined Abnormal ECG When compared with ECG of 31-Dec-2009 16:00, Atrial fibrillation has replaced Sinus rhythm Confirmed by Nautika Cressey ([id number]) on 06/20/2024 8:51:41 AM    Echo 09/17/23 demonstrated   1. Left ventricular ejection fraction, by estimation, is 60 to 65%. The  left ventricle has normal function. The left ventricle has no regional  wall motion abnormalities. Left ventricular diastolic parameters are  indeterminate.   2. Right ventricular systolic function is normal. The right ventricular  size is normal. There is normal pulmonary artery systolic pressure. The  estimated right ventricular systolic pressure is 32.8 mmHg.   3. Left atrial size was mildly dilated.   4. The mitral valve is normal in structure. Mild mitral valve  regurgitation.   5. The aortic valve is tricuspid. Aortic valve regurgitation is trivial.   6. The inferior vena cava is normal in size with greater than 50%  respiratory variability, suggesting right atrial pressure of 3 mmHg.    CHA2DS2-VASc Score = 3  The patient's score is based upon: CHF History: 0 HTN History: 0 Diabetes History: 0 Stroke History: 0 Vascular Disease History: 1 Age Score: 1 Gender Score: 1       ASSESSMENT AND PLAN: Persistent Atrial Fibrillation (ICD10:  I48.19) The patient's CHA2DS2-VASc score is 3, indicating a 3.2% annual risk of stroke.   General education about afib provided and questions answered. We also discussed her stroke risk and the risks and benefits of anticoagulation. Continue Eliquis  5 mg BID We also discussed rhythm control options. She appears to be persistent, will get confirmation once monitor is finalized. Will plan for DCCV after at least 3 weeks of anticoagulation. Recent labs reviewed.  If she has quick return of  afib, or she is paroxysmal with a high burden, will revisit AAD vs ablation for rhythm control.   Secondary Hypercoagulable State (ICD10:  D68.69) The patient is at significant risk for stroke/thromboembolism based upon her CHA2DS2-VASc Score of 3.  Continue Apixaban  (Eliquis ). No bleeding issues.   CAD/aortic atherosclerosis Noted on CT 2024 No anginal symptoms   Follow up in the AF clinic post DCCV. Patient would also like to establish care with a primary cardiologist. Her husband is a patient of Dr Francyne.     Informed Consent   Shared Decision Making/Informed Consent The risks (stroke, cardiac arrhythmias rarely resulting in the need for a temporary or permanent pacemaker, skin irritation or burns and complications associated with conscious sedation including aspiration, arrhythmia, respiratory failure and death), benefits (restoration of normal sinus rhythm) and alternatives of a direct current cardioversion were explained in detail to Ms. Erickson and she agrees to proceed.       [facility] [address] [place], [place] [address] [phone] "

## 2024-06-20 NOTE — Patient Instructions (Signed)
 Cardioversion scheduled for:  Monday January 12th   - Arrive at the [facility] ([address])  and check in with ADMITTING at 6:30 AM   - Do not eat or drink anything after midnight the night prior to your procedure.   - Take all your morning medication (except diabetic medications) with a sip of water prior to arrival.  - Do NOT miss any doses of your blood thinner - if you should miss a dose or take a dose more than 4 hours late -- please notify our office immediately.  - You will not be able to drive home after your procedure. Please ensure you have a responsible adult to drive you home. You will need someone with you for 24 hours post procedure.     - Expect to be in the procedural area approximately 2 hours.   - If you feel as if you go back into normal rhythm prior to scheduled cardioversion, please notify our office immediately.   If your procedure is canceled in the cardioversion suite you will be charged a cancellation fee.

## 2024-06-20 NOTE — Progress Notes (Signed)
 "   Primary Care Physician: Geofm Glade PARAS, MD Primary Cardiologist: None Electrophysiologist: None  Referring Physician: Dr Geofm Lauraine KATHEE Molly is a 73 y.o. female with a history of CAD, granulomatosis with polyangiitis, hypothyroidism HLD, atrial fibrillation who presents for consultation in the [facility].  The patient was initially diagnosed with atrial fibrillation 06/06/24 after presenting for a routine visit with her PCP. ECG showed rate controlled afib. A monitor was ordered which is not yet finalized. Patient is on Eliquis  for stroke prevention.   Today, patient remains in rate controlled afib today. In hindsight, she has had intermittent lightheadedness for the past 2 years. She occasionally drinks alcohol. She denies significant snoring or daytime somnolence.   Today, she denies symptoms of palpitations, chest pain, shortness of breath, orthopnea, PND, lower extremity edema, presyncope, syncope, snoring, daytime somnolence, bleeding, or neurologic sequela. The patient is tolerating medications without difficulties and is otherwise without complaint today.    Atrial Fibrillation Risk Factors:  she does not have symptoms or diagnosis of sleep apnea. she does not have a history of rheumatic fever. she does have a history of alcohol use. 3-4 drinks per week. The patient does have a history of early familial atrial fibrillation or other arrhythmias. Brother has afib.   Atrial Fibrillation Management history:  Previous antiarrhythmic drugs: none Previous cardioversions: none Previous ablations: none Anticoagulation history: Eliquis   ROS- All systems are reviewed and negative except as per the HPI above.  Past Medical History:  Diagnosis Date   Abscess of finger 11/15/2010   Allergy    Arthritis    Blood transfusion without reported diagnosis    Bronchitis    Cancer (HCC) 06/2011   Basal cell, Dr Myrna , [place]   CIN I (cervical  intraepithelial neoplasia I)    COVID-19 virus infection    Fever blister    GERD (gastroesophageal reflux disease)    on meds   Granulomatosis with polyangiitis (HCC)    Hashimoto's thyroiditis    on 100 mcg synthroid    Hematuria    Hyperlipidemia    diet controlled   Hypothyroidism    on meds   Neck pain 12/31/2014   Osteopenia    Osteoporosis    Relapsing polychondritis    Thyroid  activity decreased    on meds   Wegener's syndrome     Current Outpatient Medications  Medication Sig Dispense Refill   apixaban  (ELIQUIS ) 5 MG TABS tablet Take 1 tablet (5 mg total) by mouth 2 (two) times daily. 60 tablet 2   Cholecalciferol (VITAMIN D3) 125 MCG (5000 UT) CAPS Take by mouth daily.     levothyroxine  (SYNTHROID ) 88 MCG tablet Take 88 mcg by mouth daily before breakfast.     liothyronine (CYTOMEL) 5 MCG tablet Take 15 mcg by mouth daily. Patient takes 5mg  (Patient taking differently: Take 15 mcg by mouth daily.)     omeprazole  (PRILOSEC) 40 MG capsule Take 1 capsule (40 mg total) by mouth in the morning and at bedtime. 180 capsule 4   OVER THE COUNTER MEDICATION Probiotics (Patient taking differently: Take 1 tablet by mouth as needed. Probiotics)     raloxifene (EVISTA) 60 MG tablet Take 60 mg by mouth daily.     No current facility-administered medications for this encounter.    Physical Exam: BP (!) 140/70   Pulse (!) 55   Ht 5' 6 (1.676 m)   Wt 77 kg   BMI 27.41 kg/m   GEN:  Well nourished, well developed in no acute distress CARDIAC: Irregularly irregular rate and rhythm, no murmurs, rubs, gallops RESPIRATORY:  Clear to auscultation without rales, wheezing or rhonchi  ABDOMEN: Soft, non-tender, non-distended EXTREMITIES:  No edema; No deformity   Wt Readings from Last 3 Encounters:  06/20/24 77 kg  06/06/24 76.7 kg  03/14/24 75.8 kg     EKG Interpretation Date/Time:  Monday June 20 2024 08:43:05 EST Ventricular Rate:  55 PR Interval:    QRS  Duration:  74 QT Interval:  414 QTC Calculation: 396 R Axis:   10  Text Interpretation: Atrial fibrillation with slow ventricular response Cannot rule out Anteroseptal infarct , age undetermined Abnormal ECG When compared with ECG of 31-Dec-2009 16:00, Atrial fibrillation has replaced Sinus rhythm Confirmed by Nautika Cressey ([id number]) on 06/20/2024 8:51:41 AM    Echo 09/17/23 demonstrated   1. Left ventricular ejection fraction, by estimation, is 60 to 65%. The  left ventricle has normal function. The left ventricle has no regional  wall motion abnormalities. Left ventricular diastolic parameters are  indeterminate.   2. Right ventricular systolic function is normal. The right ventricular  size is normal. There is normal pulmonary artery systolic pressure. The  estimated right ventricular systolic pressure is 32.8 mmHg.   3. Left atrial size was mildly dilated.   4. The mitral valve is normal in structure. Mild mitral valve  regurgitation.   5. The aortic valve is tricuspid. Aortic valve regurgitation is trivial.   6. The inferior vena cava is normal in size with greater than 50%  respiratory variability, suggesting right atrial pressure of 3 mmHg.    CHA2DS2-VASc Score = 3  The patient's score is based upon: CHF History: 0 HTN History: 0 Diabetes History: 0 Stroke History: 0 Vascular Disease History: 1 Age Score: 1 Gender Score: 1       ASSESSMENT AND PLAN: Persistent Atrial Fibrillation (ICD10:  I48.19) The patient's CHA2DS2-VASc score is 3, indicating a 3.2% annual risk of stroke.   General education about afib provided and questions answered. We also discussed her stroke risk and the risks and benefits of anticoagulation. Continue Eliquis  5 mg BID We also discussed rhythm control options. She appears to be persistent, will get confirmation once monitor is finalized. Will plan for DCCV after at least 3 weeks of anticoagulation. Recent labs reviewed.  If she has quick return of  afib, or she is paroxysmal with a high burden, will revisit AAD vs ablation for rhythm control.   Secondary Hypercoagulable State (ICD10:  D68.69) The patient is at significant risk for stroke/thromboembolism based upon her CHA2DS2-VASc Score of 3.  Continue Apixaban  (Eliquis ). No bleeding issues.   CAD/aortic atherosclerosis Noted on CT 2024 No anginal symptoms   Follow up in the AF clinic post DCCV. Patient would also like to establish care with a primary cardiologist. Her husband is a patient of Dr Francyne.     Informed Consent   Shared Decision Making/Informed Consent The risks (stroke, cardiac arrhythmias rarely resulting in the need for a temporary or permanent pacemaker, skin irritation or burns and complications associated with conscious sedation including aspiration, arrhythmia, respiratory failure and death), benefits (restoration of normal sinus rhythm) and alternatives of a direct current cardioversion were explained in detail to Ms. Erickson and she agrees to proceed.       [facility] [address] [place], [place] [address] [phone] "

## 2024-06-22 DIAGNOSIS — I4891 Unspecified atrial fibrillation: Secondary | ICD-10-CM | POA: Diagnosis not present

## 2024-07-04 ENCOUNTER — Encounter (HOSPITAL_COMMUNITY): Payer: Self-pay | Admitting: Certified Registered"

## 2024-07-04 ENCOUNTER — Encounter (HOSPITAL_COMMUNITY)
Admission: RE | Disposition: A | Discharge status: Home / Self Care | Payer: Self-pay | Source: Home / Self Care | Attending: Student in an Organized Health Care Education/Training Program

## 2024-07-04 ENCOUNTER — Encounter (HOSPITAL_COMMUNITY): Payer: Self-pay | Admitting: Student in an Organized Health Care Education/Training Program

## 2024-07-04 ENCOUNTER — Ambulatory Visit (HOSPITAL_COMMUNITY): Payer: Self-pay | Admitting: Certified Registered"

## 2024-07-04 ENCOUNTER — Other Ambulatory Visit: Payer: Self-pay

## 2024-07-04 ENCOUNTER — Ambulatory Visit (HOSPITAL_COMMUNITY)
Admission: RE | Admit: 2024-07-04 | Discharge: 2024-07-04 | Disposition: A | Discharge status: Home / Self Care | Attending: Student in an Organized Health Care Education/Training Program | Admitting: Student in an Organized Health Care Education/Training Program

## 2024-07-04 DIAGNOSIS — I4819 Other persistent atrial fibrillation: Secondary | ICD-10-CM | POA: Diagnosis present

## 2024-07-04 DIAGNOSIS — E063 Autoimmune thyroiditis: Secondary | ICD-10-CM | POA: Diagnosis not present

## 2024-07-04 DIAGNOSIS — I4891 Unspecified atrial fibrillation: Secondary | ICD-10-CM

## 2024-07-04 DIAGNOSIS — M810 Age-related osteoporosis without current pathological fracture: Secondary | ICD-10-CM | POA: Diagnosis not present

## 2024-07-04 DIAGNOSIS — D6869 Other thrombophilia: Secondary | ICD-10-CM | POA: Diagnosis not present

## 2024-07-04 DIAGNOSIS — Z79890 Hormone replacement therapy (postmenopausal): Secondary | ICD-10-CM | POA: Insufficient documentation

## 2024-07-04 DIAGNOSIS — M3130 Wegener's granulomatosis without renal involvement: Secondary | ICD-10-CM | POA: Insufficient documentation

## 2024-07-04 DIAGNOSIS — Z7901 Long term (current) use of anticoagulants: Secondary | ICD-10-CM | POA: Insufficient documentation

## 2024-07-04 DIAGNOSIS — I2510 Atherosclerotic heart disease of native coronary artery without angina pectoris: Secondary | ICD-10-CM

## 2024-07-04 DIAGNOSIS — I700 Atherosclerosis of aorta: Secondary | ICD-10-CM | POA: Insufficient documentation

## 2024-07-04 DIAGNOSIS — E785 Hyperlipidemia, unspecified: Secondary | ICD-10-CM | POA: Diagnosis not present

## 2024-07-04 DIAGNOSIS — E039 Hypothyroidism, unspecified: Secondary | ICD-10-CM

## 2024-07-04 HISTORY — PX: CARDIOVERSION: EP1203

## 2024-07-04 MED ORDER — SODIUM CHLORIDE 0.9 % IV SOLN: INTRAVENOUS | Status: DC

## 2024-07-04 MED ORDER — LIDOCAINE 2% (20 MG/ML) 5 ML SYRINGE
INTRAMUSCULAR | Status: DC | PRN
  Administered 2024-07-04: 60 mg via INTRAVENOUS

## 2024-07-04 MED ORDER — PROPOFOL 10 MG/ML IV BOLUS
INTRAVENOUS | Status: DC | PRN
  Administered 2024-07-04: 60 mg via INTRAVENOUS

## 2024-07-04 NOTE — Anesthesia Postprocedure Evaluation (Signed)
"   Anesthesia Post Note  Patient: Molly Erickson  Procedure(s) Performed: CARDIOVERSION     Patient location during evaluation: PACU Anesthesia Type: General Level of consciousness: awake and alert Pain management: pain level controlled Vital Signs Assessment: post-procedure vital signs reviewed and stable Respiratory status: spontaneous breathing, nonlabored ventilation and respiratory function stable Cardiovascular status: blood pressure returned to baseline and stable Postop Assessment: no apparent nausea or vomiting Anesthetic complications: no   No notable events documented.  Last Vitals:  Vitals:   07/04/24 0740 07/04/24 0750  BP: 113/67 110/62  Pulse: 60 (!) 56  Resp: 12 17  Temp:    SpO2: 98% 98%    Last Pain:  Vitals:   07/04/24 0649  TempSrc: Temporal  PainSc: 0-No pain                 Butler Levander Pinal      "

## 2024-07-04 NOTE — Interval H&P Note (Signed)
 History and Physical Interval Note:  07/04/2024 7:25 AM  Molly Erickson  has presented today for surgery, with the diagnosis of afib.  The various methods of treatment have been discussed with the patient and family. After consideration of risks, benefits and other options for treatment, the patient has consented to  Procedures: CARDIOVERSION (N/A) as a surgical intervention.  The patient's history has been reviewed, patient examined, no change in status, stable for surgery.  I have reviewed the patient's chart and labs.  Questions were answered to the patient's satisfaction.     Georganna Archer

## 2024-07-04 NOTE — Transfer of Care (Signed)
 Immediate Anesthesia Transfer of Care Note  Patient: Molly Erickson  Procedure(s) Performed: CARDIOVERSION  Patient Location: PACU and Cath Lab  Anesthesia Type:General  Level of Consciousness: awake and sedated  Airway & Oxygen Therapy: Patient Spontanous Breathing and Patient connected to nasal cannula oxygen  Post-op Assessment: Report given to RN and Post -op Vital signs reviewed and stable  Post vital signs: Reviewed and stable  Last Vitals:  Vitals Value Taken Time  BP    Temp    Pulse    Resp    SpO2      Last Pain:  Vitals:   07/04/24 0649  TempSrc: Temporal  PainSc: 0-No pain         Complications: No notable events documented.

## 2024-07-04 NOTE — Anesthesia Preprocedure Evaluation (Signed)
"                                    Anesthesia Evaluation  Patient identified by MRN, date of birth, ID band Patient awake    Reviewed: Allergy & Precautions, H&P , NPO status , Patient's Chart, lab work & pertinent test results  Airway Mallampati: II  TM Distance: >3 FB Neck ROM: Full    Dental  (+) Dental Advisory Given   Pulmonary neg pulmonary ROS   Pulmonary exam normal breath sounds clear to auscultation       Cardiovascular + CAD  Normal cardiovascular exam+ dysrhythmias Atrial Fibrillation  Rhythm:Regular Rate:Normal     Neuro/Psych negative neurological ROS  negative psych ROS   GI/Hepatic Neg liver ROS,GERD  ,,  Endo/Other  Hypothyroidism    Renal/GU negative Renal ROS  negative genitourinary   Musculoskeletal  (+) Arthritis ,    Abdominal   Peds negative pediatric ROS (+)  Hematology negative hematology ROS (+)   Anesthesia Other Findings   Reproductive/Obstetrics negative OB ROS                              Anesthesia Physical Anesthesia Plan  ASA: 3  Anesthesia Plan: General   Post-op Pain Management: Minimal or no pain anticipated   Induction: Intravenous  PONV Risk Score and Plan: 3 and Propofol  infusion and Treatment may vary due to age or medical condition  Airway Management Planned: Nasal Cannula and Simple Face Mask  Additional Equipment:   Intra-op Plan:   Post-operative Plan:   Informed Consent: I have reviewed the patients History and Physical, chart, labs and discussed the procedure including the risks, benefits and alternatives for the proposed anesthesia with the patient or authorized representative who has indicated his/her understanding and acceptance.     Dental advisory given  Plan Discussed with: CRNA  Anesthesia Plan Comments:         Anesthesia Quick Evaluation  "

## 2024-07-04 NOTE — CV Procedure (Signed)
" ° °  CARDIOVERSION    Procedure:   DCCV  Indication:  Symptomatic atrial fibrillation  Procedure Note:  The patient signed informed consent.  The patient has had therapeutic oral anticoagulation uninterrupted for greater than 3 weeks.  Anesthesia was administered per the anesthesia team.  Adequate airway was maintained throughout and vital followed per protocol.  The patient was cardioverted x 1 with 360J of biphasic synchronized energy.  The patient converted to NSR.  There were no apparent complications.  The patient had normal neuro status and respiratory status post procedure with vitals stable as recorded elsewhere.     Shateria Paternostro T. Floretta HEATH, MD [place]  [facility]  7:33 AM  "

## 2024-07-04 NOTE — Discharge Instructions (Signed)
 Electrical Cardioversion Electrical cardioversion is the delivery of a jolt of electricity to restore a normal rhythm to the heart. A rhythm that is too fast or is not regular keeps the heart from pumping well. In this procedure, sticky patches or metal paddles are placed on the chest to deliver electricity to the heart from a device. This procedure may be done in an emergency if: There is low or no blood pressure as a result of the heart rhythm. Normal rhythm must be restored as fast as possible to protect the brain and heart from further damage. It may save a life. This may also be a scheduled procedure for irregular or fast heart rhythms that are not immediately life-threatening.  What can I expect after the procedure? Your blood pressure, heart rate, breathing rate, and blood oxygen level will be monitored until you leave the hospital or clinic. Your heart rhythm will be watched to make sure it does not change. You may have some redness on the skin where the shocks were given. Over the counter cortizone cream may be helpful.  Follow these instructions at home: Do not drive for 24 hours if you were given a sedative during your procedure. Take over-the-counter and prescription medicines only as told by your health care provider. Ask your health care provider how to check your pulse. Check it often. Rest for 48 hours after the procedure or as told by your health care provider. Avoid or limit your caffeine use as told by your health care provider. Keep all follow-up visits as told by your health care provider. This is important. Contact a health care provider if: You feel like your heart is beating too quickly or your pulse is not regular. You have a serious muscle cramp that does not go away. Get help right away if: You have discomfort in your chest. You are dizzy or you feel faint. You have trouble breathing or you are short of breath. Your speech is slurred. You have trouble moving an  arm or leg on one side of your body. Your fingers or toes turn cold or blue. Summary Electrical cardioversion is the delivery of a jolt of electricity to restore a normal rhythm to the heart. This procedure may be done right away in an emergency or may be a scheduled procedure if the condition is not an emergency. Generally, this is a safe procedure. After the procedure, check your pulse often as told by your health care provider. This information is not intended to replace advice given to you by your health care provider. Make sure you discuss any questions you have with your health care provider. Document Revised: 01/10/2019 Document Reviewed: 01/10/2019 Elsevier Patient EducatiElectrical Cardioversion Electrical cardioversion is the delivery of a jolt of electricity to restore a normal rhythm to the heart. A rhythm that is too fast or is not regular (arrhythmia) keeps the heart from pumping blood well. There is also another type of cardioversion called a chemical (pharmacologic) cardioversion. This is when your health care provider gives you one or more medicines to bring back your regular heart rhythm. Electrical cardioversion is done as a scheduled procedure for arrhythmiasthat are not life-threatening. Electrical cardioversion may also be done in an emergency for sudden life-threatening arrhythmias. Tell a health care provider about: Any allergies you have. All medicines you are taking, including vitamins, herbs, eye drops, creams, and over-the-counter medicines. Any problems you or family members have had with sedatives or anesthesia. Any bleeding problems you have. Any surgeries you  have had, including a pacemaker, defibrillator, or other implanted device. Any medical conditions you have. Whether you are pregnant or may be pregnant. What are the risks? Your provider will talk with you about risks. These include: Allergic reactions to medicines. Irritation to the skin on your chest or  back where the sticky pads (electrodes) or paddles were put during electrical cardioversion. A blood clot that breaks free and travels to other parts of your body, such as your brain. Return of a worse abnormal heart rhythm that will need to be treated with medicines, a pacemaker, or an implantable cardioverter defibrillator (ICD). What happens before the procedure? Medicines Your provider may give you: Blood-thinning medicines (anticoagulants) so your blood does not clot as easily. If your provider gives you this medicine, you may need to take it for 4 weeks before the procedure. Medicines to help stabilize your heart rate and rhythm. Ask your provider about: Changing or stopping your regular medicines. These include any diabetes medicines or blood thinners you take. Taking medicines such as aspirin and ibuprofen. These medicines can thin your blood. Do not take them unless your provider tells you to. Taking over-the-counter medicines, vitamins, herbs, and supplements. General instructions Follow instructions from your provider about what you may eat and drink. Do not put any lotions, powders, or ointments on your chest and back for 24 hours before the procedure. They can cause problems with the electrodes or paddles used to deliver electricity to your heart. Do not wear jewelry as this can interfere with delivering electricity to your heart. If you will be going home right after the procedure, plan to have a responsible adult: Take you home from the hospital or clinic. You will not be allowed to drive. Care for you for the time you are told. Tests You may have an exam or testing. This may include: Blood labs. A transesophageal echocardiogram (TEE). What happens during the procedure?     An IV will be inserted into one of your veins. You will be given a sedative. This helps you relax. Electrodes or metal paddles will be placed on your chest. They may be placed in one of these ways: One  placed on your right chest, the other on the left ribs. One placed on your chest and the other on your back. An electrical shock will be delivered. The shock briefly stops (resets) your heart rhythm. Your provider will check to see if your heart rhythm is now normal. Some people need only one shock. Some need more to restore a normal heart rhythm. The procedure may vary among providers and hospitals. What happens after the procedure? Your blood pressure, heart rate, breathing rate, and blood oxygen level will be monitored until you leave the hospital or clinic. Your heart rhythm will be watched to make sure it does not change. This information is not intended to replace advice given to you by your health care provider. Make sure you discuss any questions you have with your health care provider. Document Revised: 01/30/2022 Document Reviewed: 01/30/2022 Elsevier Patient Education  2024 [employer]on  2020 [employer].

## 2024-07-05 ENCOUNTER — Telehealth: Payer: Self-pay | Admitting: Student in an Organized Health Care Education/Training Program

## 2024-07-05 ENCOUNTER — Ambulatory Visit

## 2024-07-05 ENCOUNTER — Ambulatory Visit: Admitting: Cardiology

## 2024-07-05 ENCOUNTER — Telehealth (HOSPITAL_COMMUNITY): Payer: Self-pay | Admitting: *Deleted

## 2024-07-05 ENCOUNTER — Encounter: Payer: Self-pay | Admitting: Physician Assistant

## 2024-07-05 ENCOUNTER — Ambulatory Visit: Attending: Vascular Surgery | Admitting: Physician Assistant

## 2024-07-05 ENCOUNTER — Encounter: Payer: Self-pay | Admitting: Cardiology

## 2024-07-05 VITALS — BP 136/70 | HR 57 | Ht 66.0 in | Wt 167.0 lb

## 2024-07-05 VITALS — BP 118/72 | HR 61 | Temp 97.7°F | Wt 167.7 lb

## 2024-07-05 DIAGNOSIS — I4819 Other persistent atrial fibrillation: Secondary | ICD-10-CM

## 2024-07-05 DIAGNOSIS — Z79899 Other long term (current) drug therapy: Secondary | ICD-10-CM | POA: Diagnosis not present

## 2024-07-05 DIAGNOSIS — R931 Abnormal findings on diagnostic imaging of heart and coronary circulation: Secondary | ICD-10-CM

## 2024-07-05 DIAGNOSIS — I872 Venous insufficiency (chronic) (peripheral): Secondary | ICD-10-CM | POA: Diagnosis not present

## 2024-07-05 DIAGNOSIS — I700 Atherosclerosis of aorta: Secondary | ICD-10-CM

## 2024-07-05 DIAGNOSIS — R42 Dizziness and giddiness: Secondary | ICD-10-CM | POA: Diagnosis not present

## 2024-07-05 DIAGNOSIS — E7800 Pure hypercholesterolemia, unspecified: Secondary | ICD-10-CM

## 2024-07-05 DIAGNOSIS — I2510 Atherosclerotic heart disease of native coronary artery without angina pectoris: Secondary | ICD-10-CM

## 2024-07-05 MED ORDER — ROSUVASTATIN CALCIUM 5 MG PO TABS: 5.0000 mg | ORAL_TABLET | Freq: Every day | ORAL | 3 refills | Status: AC

## 2024-07-05 NOTE — Progress Notes (Signed)
 " Office Note     CC:  follow up Requesting Provider:  Geofm Glade PARAS, MD  HPI: Molly Erickson is a 74 y.o. (1950-11-30) female who presents for evaluation of varicose veins especially of the right lower extremity.  She was diagnosed with a superficial thrombophlebitis of the right greater saphenous vein around the level of the knee in September of last year.  She did not have a DVT and does not have history of DVT.  She also has no history of venous ulcerations, significant trauma to the right lower extremity.  She does not wear compression except for when traveling long distances.  She elevates her legs when possible during the day.  Of note she underwent cardioversion yesterday at [facility] for atrial fibrillation by Dr. Floretta.  While checking the patient's vitals she developed dizziness and felt sweaty and flushed  Symptoms increased over the course of the office visit.   During this episode her heart rate was 70 and blood pressure was 118/72.  She had a normal breakfast this morning.  She denied any shortness of breath or chest pain.   Past Medical History:  Diagnosis Date   Abscess of finger 11/15/2010   Allergy    Arthritis    Blood transfusion without reported diagnosis    Bronchitis    Cancer (HCC) 06/2011   Basal cell, Dr Myrna , [place]   CIN I (cervical intraepithelial neoplasia I)    COVID-19 virus infection    Fever blister    GERD (gastroesophageal reflux disease)    on meds   Granulomatosis with polyangiitis (HCC)    Hashimoto's thyroiditis    on 100 mcg synthroid    Hematuria    Hyperlipidemia    diet controlled   Hypothyroidism    on meds   Neck pain 12/31/2014   Osteopenia    Osteoporosis    Relapsing polychondritis    Thyroid  activity decreased    on meds   Wegener's syndrome     Past Surgical History:  Procedure Laterality Date   BREAST BIOPSY     Papilloma-Intraductal   BREAST LUMPECTOMY     benign   CARDIOVERSION N/A 07/04/2024    Procedure: CARDIOVERSION;  Surgeon: Floretta Mallard, MD;  Location: [facility] INVASIVE CV LAB;  Service: Cardiovascular;  Laterality: N/A;   COLONOSCOPY  2014   DB-MAC-moviprep (good)-HPP   COLPOSCOPY     ESOPHAGOGASTRODUODENOSCOPY (EGD) WITH ESOPHAGEAL DILATION  05/01/2021   Dr.Perry   LUNG BIOPSY     UPPER GASTROINTESTINAL ENDOSCOPY  07/2020   JP-MAC-moderate stenosis/esophagitis   UPPER GASTROINTESTINAL ENDOSCOPY  2023   JP-dilation 18-20 mm   WISDOM TOOTH EXTRACTION      Social History   Socioeconomic History   Marital status: Married    Spouse name: Not on file   Number of children: Not on file   Years of education: Not on file   Highest education level: Master's degree (e.g., MA, MS, MEng, MEd, MSW, MBA)  Occupational History   Not on file  Tobacco Use   Smoking status: Never   Smokeless tobacco: Never  Vaping Use   Vaping status: Never Used  Substance and Sexual Activity   Alcohol use: Yes    Alcohol/week: 4.0 standard drinks of alcohol    Types: 4 Standard drinks or equivalent per week    Comment: socially   Drug use: No   Sexual activity: Yes    Birth control/protection: Post-menopausal  Other Topics Concern   Not on file  Social History Narrative   Not on file   Social Drivers of Health   Tobacco Use: Low Risk (07/05/2024)   Patient History    Smoking Tobacco Use: Never    Smokeless Tobacco Use: Never    Passive Exposure: Not on file  Financial Resource Strain: Low Risk (06/06/2024)   Overall Financial Resource Strain (CARDIA)    Difficulty of Paying Living Expenses: Not hard at all  Food Insecurity: No Food Insecurity (06/06/2024)   Epic    Worried About Programme Researcher, Broadcasting/film/video in the Last Year: Never true    Ran Out of Food in the Last Year: Never true  Transportation Needs: No Transportation Needs (06/06/2024)   Epic    Lack of Transportation (Medical): No    Lack of Transportation (Non-Medical): No  Physical Activity: Insufficiently Active (06/06/2024)    Exercise Vital Sign    Days of Exercise per Week: 3 days    Minutes of Exercise per Session: 30 min  Stress: No Stress Concern Present (06/06/2024)   [employer] of Occupational Health - Occupational Stress Questionnaire    Feeling of Stress: Not at all  Social Connections: Socially Integrated (06/06/2024)   Social Connection and Isolation Panel    Frequency of Communication with Friends and Family: More than three times a week    Frequency of Social Gatherings with Friends and Family: More than three times a week    Attends Religious Services: 1 to 4 times per year    Active Member of Clubs or Organizations: Yes    Attends Banker Meetings: More than 4 times per year    Marital Status: Married  Catering Manager Violence: Not At Risk (06/06/2024)   Epic    Fear of Current or Ex-Partner: No    Emotionally Abused: No    Physically Abused: No    Sexually Abused: No  Depression (PHQ2-9): Low Risk (06/06/2024)   Depression (PHQ2-9)    PHQ-2 Score: 0  Alcohol Screen: Low Risk (06/06/2024)   Alcohol Screen    Last Alcohol Screening Score (AUDIT): 3  Housing: Low Risk (06/06/2024)   Epic    Unable to Pay for Housing in the Last Year: No    Number of Times Moved in the Last Year: 0    Homeless in the Last Year: No  Utilities: Not At Risk (06/06/2024)   Epic    Threatened with loss of utilities: No  Health Literacy: Adequate Health Literacy (06/06/2024)   B1300 Health Literacy    Frequency of need for help with medical instructions: Never    Family History  Problem Relation Age of Onset   Coronary artery disease Father    Hypertension Father    Prostate cancer Father    Skin cancer Father    Heart disease Father    Breast cancer Mother    Leukemia Mother    Breast cancer Maternal Aunt    Diabetes Daughter    Colon cancer Neg Hx    Esophageal cancer Neg Hx    Rectal cancer Neg Hx    Stomach cancer Neg Hx    Colon polyps Neg Hx     Current Outpatient  Medications  Medication Sig Dispense Refill   apixaban  (ELIQUIS ) 5 MG TABS tablet Take 1 tablet (5 mg total) by mouth 2 (two) times daily. 60 tablet 2   Calcium -Phosphorus-Vitamin D  (CALCIUM  GUMMIES PO) Take 2-3 each by mouth daily.     Cholecalciferol (VITAMIN D3) 125 MCG (5000 UT) CAPS  Take 5,000 Units by mouth daily.     cyanocobalamin  (VITAMIN B12) 1000 MCG tablet Take 1,000 mcg by mouth daily.     levothyroxine  (SYNTHROID ) 88 MCG tablet Take 88 mcg by mouth daily before breakfast.     liothyronine (CYTOMEL) 5 MCG tablet Take 15 mcg by mouth daily. Patient takes 5mg  (Patient taking differently: Take 15 mcg by mouth daily.)     omeprazole  (PRILOSEC) 40 MG capsule Take 1 capsule (40 mg total) by mouth in the morning and at bedtime. (Patient taking differently: Take 80 mg by mouth at bedtime.) 180 capsule 4   raloxifene (EVISTA) 60 MG tablet Take 60 mg by mouth daily.     No current facility-administered medications for this visit.    Allergies[1]   REVIEW OF SYSTEMS:  Negative unless noted in HPI [X]  denotes positive finding, [ ]  denotes negative finding Cardiac  Comments:  Chest pain or chest pressure:    Shortness of breath upon exertion:    Short of breath when lying flat:    Irregular heart rhythm:        Vascular    Pain in calf, thigh, or hip brought on by ambulation:    Pain in feet at night that wakes you up from your sleep:     Blood clot in your veins:    Leg swelling:         Pulmonary    Oxygen at home:    Productive cough:     Wheezing:         Neurologic    Sudden weakness in arms or legs:     Sudden numbness in arms or legs:     Sudden onset of difficulty speaking or slurred speech:    Temporary loss of vision in one eye:     Problems with dizziness:         Gastrointestinal    Blood in stool:     Vomited blood:         Genitourinary    Burning when urinating:     Blood in urine:        Psychiatric    Major depression:         Hematologic     Bleeding problems:    Problems with blood clotting too easily:        Skin    Rashes or ulcers:        Constitutional    Fever or chills:      PHYSICAL EXAMINATION:  Vitals:   07/05/24 0934  BP: 118/72  Pulse: 61  Temp: 97.7 F (36.5 C)  TempSrc: Temporal  Weight: 167 lb 11.2 oz (76.1 kg)    General:  WDWN in NAD; vital signs documented above Gait: Not observed HENT: WNL, normocephalic Pulmonary: normal non-labored breathing Cardiac: regular HR Abdomen: soft, NT, no masses Skin: without rashes Vascular Exam/Pulses: palpable AT pulses BLE Extremities: Palpable cord right medial knee; prominent spider veins right leg and ankle; no significant swelling bilateral lower extremities; no open wounds Musculoskeletal: no muscle wasting or atrophy  Neurologic: A&O X 3 Psychiatric:  The pt has Normal affect.   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 female here for evaluation of prominent veins in the right lower extremity with episode of superficial thrombophlebitis of the GSV in September of last  year  Molly Erickson is a 74 year old female referred to the clinic due to an episode of superficial thrombophlebitis of the right greater saphenous vein in September of this year.  She was negative for DVT at the time and has no history of DVT.  She has a long history of prominent veins in the lower legs especially of the right ankle.  She still has a palpable cord in the area of her GSV around the level of the knee but this is no longer tender.  She is on Eliquis  for atrial fibrillation.  She is not bothered much by swelling.  Right lower extremity venous reflux study is negative for DVT.  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  Recommendations to prevent worsening spider veins or episodes of superficial thrombophlebitis in the future would involve regular use of compression socks, proper leg elevation periodically during the day, and avoidance of prolonged sitting and standing.  She is not bothered by swelling and thus understandably does not want to use compression socks on a daily basis.  She however felt better with assurance that she does not have any blood clots.  It should also be noted that the patient underwent cardioversion yesterday with Dr. Floretta due to atrial fibrillation.  While taking her vital signs at the beginning of her office visit she became dizzy and sweaty.  This increased throughout the course of the office visit.  She never had any chest pain or shortness of breath.  By my exam her rhythm remained regular throughout her office visit.  She also remained normotensive and with a heart rate between 60 and 70.  We reached out to the A-fib clinic however they were unable to see the patient due to short staffing and recommended contacting heart care on the fifth floor or sending the patient to the emergency room.  Heart care was contacted and ultimately able to accommodate the patient with an office visit.  Molly Erickson felt better after having 2 cups of water prior to sending her to the fifth floor.   Molly Sender, PA-C [facility] [phone]  Clinic MD:   Gretta     [1] No Known Allergies  "

## 2024-07-05 NOTE — Telephone Encounter (Signed)
 [facility] calling pt is there and has dizziness after Cardioversion. Need to know if pt should go to ER or to see DOD today.

## 2024-07-05 NOTE — Progress Notes (Signed)
 0    Cardiology CONSULT Note    Date:  07/05/2024   ID:  ZYLEE MARCHIANO, DOB [birth year], MRN [id number]  PCP:  Geofm Glade PARAS, MD  Cardiologist:  Wilbert Bihari, MD   Chief Complaint  Patient presents with   New Patient (Initial Visit)    Dizziness, atrial fibrillation, coronary artery calcifications, hyperlipidemia, aortic atherosclerosis    Patient Profile: Molly Erickson is a 74 y.o. female who is being seen today for the evaluation of atrial fibrillation at the request of Burns, Glade PARAS, MD.  History of Present Illness:  Molly Erickson is a 74 y.o. female who is being seen today for the evaluation of atrial fibrillation at the request of Burns, Stacy J, MD.  This is a 74 year old female with a history of GERD, granulomatosis polyangiitis, Hashimoto's thyroiditis, hyperlipidemia, hypothyroidism, osteoporosis, Wegener's granulomatosis and atrial fibrillation.  She has not been seen by cardiology and only been seen in the A-fib clinic.  She apparently was diagnosed with A-fib [age] after presenting for routine visit with her PCP.  EKG showed controlled A-fib.  She was already on Eliquis  for stroke prevention.  She complained of intermittent lightheadedness for 2 years prior to development of A-fib.  She did admit to drinking alcohol some.  She denies any snoring or daytime somnolence.  At office visit 06/20/2024 she was continued on Eliquis  5 mg twice daily and set up for cardioversion.  She also had a CAT scan of note in 2024 showing coronary artery calcifications and aortic atherosclerosis but no workup did not done and she denied any anginal symptoms.  Her husband is a patient Dr. Tyrone and wanted to establish care with him at some point.  She underwent direct-current cardioversion successfully without any complications yesterday to normal sinus rhythm.  Today she was seen in [facility] and apparently complained of dizziness while sitting.SABRA  Heart rate was in the 60s.  They gave  her some hydration in the office and symptoms improved.  There was no availability in A-fib clinic and now she presents for further evaluation to cardiology.  She tells me that while she was getting her blood pressure checked she started to feel dizzy and felt warm all over and flushed.  She talked through the entire episode. She continued to feel dizzy after the BP cuff came off.  She was given some water and felt better. She says that the BP cuff was VERY tight.  Currently she feels fine but has a mild tension HA. She denies any CP or pressure, SOB, DOE, PND, orthopnea, palpitations since DCCV or syncope.  Past Medical History:  Diagnosis Date   Abscess of finger 11/15/2010   Allergy    Arthritis    Blood transfusion without reported diagnosis    Bronchitis    Cancer (HCC) 06/2011   Basal cell, Dr Myrna , [place]   CIN I (cervical intraepithelial neoplasia I)    COVID-19 virus infection    Fever blister    GERD (gastroesophageal reflux disease)    on meds   Granulomatosis with polyangiitis (HCC)    Hashimoto's thyroiditis    on 100 mcg synthroid    Hematuria    Hyperlipidemia    diet controlled   Hypothyroidism    on meds   Neck pain 12/31/2014   Osteopenia    Osteoporosis    Relapsing polychondritis    Thyroid  activity decreased    on meds   Wegener's syndrome     Past  Surgical History:  Procedure Laterality Date   BREAST BIOPSY     Papilloma-Intraductal   BREAST LUMPECTOMY     benign   CARDIOVERSION N/A 07/04/2024   Procedure: CARDIOVERSION;  Surgeon: Floretta Mallard, MD;  Location: [facility] INVASIVE CV LAB;  Service: Cardiovascular;  Laterality: N/A;   COLONOSCOPY  2014   DB-MAC-moviprep (good)-HPP   COLPOSCOPY     ESOPHAGOGASTRODUODENOSCOPY (EGD) WITH ESOPHAGEAL DILATION  05/01/2021   Dr.Perry   LUNG BIOPSY     UPPER GASTROINTESTINAL ENDOSCOPY  07/2020   JP-MAC-moderate stenosis/esophagitis   UPPER GASTROINTESTINAL ENDOSCOPY  2023   JP-dilation 18-20 mm   WISDOM  TOOTH EXTRACTION      Current Medications: Active Medications[1]  Allergies:   Patient has no known allergies.   Social History   Socioeconomic History   Marital status: Married    Spouse name: Not on file   Number of children: Not on file   Years of education: Not on file   Highest education level: Master's degree (e.g., MA, MS, MEng, MEd, MSW, MBA)  Occupational History   Not on file  Tobacco Use   Smoking status: Never   Smokeless tobacco: Never  Vaping Use   Vaping status: Never Used  Substance and Sexual Activity   Alcohol use: Yes    Alcohol/week: 4.0 standard drinks of alcohol    Types: 4 Standard drinks or equivalent per week    Comment: socially   Drug use: No   Sexual activity: Yes    Birth control/protection: Post-menopausal  Other Topics Concern   Not on file  Social History Narrative   Not on file   Social Drivers of Health   Tobacco Use: Low Risk (07/05/2024)   Patient History    Smoking Tobacco Use: Never    Smokeless Tobacco Use: Never    Passive Exposure: Not on file  Financial Resource Strain: Low Risk (06/06/2024)   Overall Financial Resource Strain (CARDIA)    Difficulty of Paying Living Expenses: Not hard at all  Food Insecurity: No Food Insecurity (06/06/2024)   Epic    Worried About Radiation Protection Practitioner of Food in the Last Year: Never true    Ran Out of Food in the Last Year: Never true  Transportation Needs: No Transportation Needs (06/06/2024)   Epic    Lack of Transportation (Medical): No    Lack of Transportation (Non-Medical): No  Physical Activity: Insufficiently Active (06/06/2024)   Exercise Vital Sign    Days of Exercise per Week: 3 days    Minutes of Exercise per Session: 30 min  Stress: No Stress Concern Present (06/06/2024)   [employer] of Occupational Health - Occupational Stress Questionnaire    Feeling of Stress: Not at all  Social Connections: Socially Integrated (06/06/2024)   Social Connection and Isolation Panel     Frequency of Communication with Friends and Family: More than three times a week    Frequency of Social Gatherings with Friends and Family: More than three times a week    Attends Religious Services: 1 to 4 times per year    Active Member of Clubs or Organizations: Yes    Attends Banker Meetings: More than 4 times per year    Marital Status: Married  Depression (PHQ2-9): Low Risk (06/06/2024)   Depression (PHQ2-9)    PHQ-2 Score: 0  Alcohol Screen: Low Risk (06/06/2024)   Alcohol Screen    Last Alcohol Screening Score (AUDIT): 3  Housing: Low Risk (06/06/2024)   Epic  Unable to Pay for Housing in the Last Year: No    Number of Times Moved in the Last Year: 0    Homeless in the Last Year: No  Utilities: Not At Risk (06/06/2024)   Epic    Threatened with loss of utilities: No  Health Literacy: Adequate Health Literacy (06/06/2024)   B1300 Health Literacy    Frequency of need for help with medical instructions: Never     Family History:  The patient's family history includes Breast cancer in her maternal aunt and mother; Coronary artery disease in her father; Diabetes in her daughter; Heart disease in her father; Hypertension in her father; Leukemia in her mother; Prostate cancer in her father; Skin cancer in her father.   ROS:   Please see the history of present illness.    ROS All other systems reviewed and are negative.      No data to display             PHYSICAL EXAM:   VS:  BP 136/70   Pulse (!) 57   Ht 5' 6 (1.676 m)   Wt 167 lb (75.8 kg)   SpO2 100%   BMI 26.95 kg/m    GEN: Well nourished, well developed, in no acute distress  HEENT: normal  Neck: no JVD, carotid bruits, or masses Cardiac: RRR; no murmurs, rubs, or gallops,no edema.  Intact distal pulses bilaterally.  Respiratory:  clear to auscultation bilaterally, normal work of breathing GI: soft, nontender, nondistended, + BS MS: no deformity or atrophy  Skin: warm and dry, no  rash Neuro:  Alert and Oriented x 3, Strength and sensation are intact Psych: euthymic mood, full affect  Wt Readings from Last 3 Encounters:  07/05/24 167 lb (75.8 kg)  07/05/24 167 lb 11.2 oz (76.1 kg)  07/04/24 163 lb (73.9 kg)      Studies/Labs Reviewed:   EKG Interpretation Date/Time:  Tuesday July 05 2024 13:34:53 EST Ventricular Rate:  57 PR Interval:  288 QRS Duration:  66 QT Interval:  418 QTC Calculation: 406 R Axis:   7  Text Interpretation: Sinus bradycardia with 1st degree A-V block When compared with ECG of 04-Jul-2024 07:45, Premature atrial complexes are no longer Present Confirmed by Shlomo Corning [phone]) on 07/05/2024 1:56:46 PM   Cardiac Studies & Procedures   ______________________________________________________________________________________________     ECHOCARDIOGRAM  ECHOCARDIOGRAM COMPLETE Erickson  Narrative ECHOCARDIOGRAM REPORT    Patient Name:   Molly Erickson Medical Rec #:  [id number]          Height:       67.0 in Accession #:    [id number]         Weight:       165.0 lb Date of Birth:  [birth year]           BSA:          1.863 m Patient Age:    72 years           BP:           110/60 mmHg Patient Gender: F                  HR:           61 bpm. Exam Location:  Outpatient  Procedure: 2D Echo, 3D Echo, Cardiac Doppler, Color Doppler and Strain Analysis (Both Spectral and Color Flow Doppler were utilized during procedure).  Indications:     Cardiomegaly I51.7  History:         Patient has no prior history of Echocardiogram examinations. CAD; Risk Factors:Non-Smoker and Dyslipidemia.  Sonographer:     Orvil Holmes RDCS Referring Phys:  [id number] STACY J BURNS Diagnosing Phys: [place]  IMPRESSIONS   1. Left ventricular ejection fraction, by estimation, is 60 to 65%. The left ventricle has normal function. The left ventricle has no regional wall motion abnormalities. Left ventricular diastolic  parameters are indeterminate. 2. Right ventricular systolic function is normal. The right ventricular size is normal. There is normal pulmonary artery systolic pressure. The estimated right ventricular systolic pressure is 32.8 mmHg. 3. Left atrial size was mildly dilated. 4. The mitral valve is normal in structure. Mild mitral valve regurgitation. 5. The aortic valve is tricuspid. Aortic valve regurgitation is trivial. 6. The inferior vena cava is normal in size with greater than 50% respiratory variability, suggesting right atrial pressure of 3 mmHg.  FINDINGS Left Ventricle: Left ventricular ejection fraction, by estimation, is 60 to 65%. The left ventricle has normal function. The left ventricle has no regional wall motion abnormalities. Global longitudinal strain performed but not reported based on interpreter judgement due to suboptimal tracking. The left ventricular internal cavity size was normal in size. There is borderline left ventricular hypertrophy. Left ventricular diastolic parameters are indeterminate.  Right Ventricle: The right ventricular size is normal. Right ventricular systolic function is normal. There is normal pulmonary artery systolic pressure. The tricuspid regurgitant velocity is 2.73 m/s, and with an assumed right atrial pressure of 3 mmHg, the estimated right ventricular systolic pressure is 32.8 mmHg.  Left Atrium: Left atrial size was mildly dilated.  Right Atrium: Right atrial size was normal in size.  Pericardium: There is no evidence of pericardial effusion.  Mitral Valve: The mitral valve is normal in structure. Mild mitral valve regurgitation.  Tricuspid Valve: Tricuspid valve regurgitation is mild.  Aortic Valve: The aortic valve is tricuspid. Aortic valve regurgitation is trivial.  Pulmonic Valve: Pulmonic valve regurgitation is mild.  Aorta: The aortic root and ascending aorta are structurally normal, with no evidence of dilitation.  Venous: The  inferior vena cava is normal in size with greater than 50% respiratory variability, suggesting right atrial pressure of 3 mmHg.  IAS/Shunts: No atrial level shunt detected by color flow Doppler.  Additional Comments: 3D was performed not requiring image post processing on an independent workstation and was normal.   LEFT VENTRICLE PLAX 2D LVIDd:         3.74 cm   Diastology LVIDs:         2.80 cm   LV e' medial:    7.07 cm/s LV PW:         1.32 cm   LV E/e' medial:  11.2 LV IVS:        0.96 cm   LV e' lateral:   9.79 cm/s LVOT diam:     2.00 cm   LV E/e' lateral: 8.1 LV SV:         47 LV SV Index:   25 LVOT Area:     3.14 cm  3D Volume EF: 3D EF:        63 % LV EDV:       117 ml LV ESV:       43 ml LV SV:        74 ml  RIGHT VENTRICLE RV Basal diam:  3.27 cm RV Mid diam:    2.29 cm RV S prime:  8.81 cm/s TAPSE (M-mode): 1.6 cm  LEFT ATRIUM             Index        RIGHT ATRIUM           Index LA diam:        4.80 cm 2.58 cm/m   RA Area:     16.60 cm LA Vol (A2C):   57.4 ml 30.80 ml/m  RA Volume:   45.00 ml  24.15 ml/m LA Vol (A4C):   57.8 ml 31.02 ml/m LA Biplane Vol: 58.9 ml 31.61 ml/m AORTIC VALVE LVOT Vmax:   78.00 cm/s LVOT Vmean:  50.400 cm/s LVOT VTI:    0.150 m  AORTA Ao Root diam: 2.70 cm Ao Asc diam:  3.40 cm  MITRAL VALVE                  TRICUSPID VALVE MV Area (PHT): 3.99 cm       TR Peak grad:   29.8 mmHg MV Decel Time: 190 msec       TR Vmax:        273.00 cm/s MR Peak grad:    77.8 mmHg MR Mean grad:    61.0 mmHg    SHUNTS MR Vmax:         441.00 cm/s  Systemic VTI:  0.15 m MR Vmean:        381.0 cm/s   Systemic Diam: 2.00 cm MR PISA:         1.01 cm MR PISA Eff ROA: 7 mm MR PISA Radius:  0.40 cm MV E velocity: 79.20 cm/s MV A velocity: 28.70 cm/s MV E/A ratio:  2.76  Photographer signed by Ronal Ross Signature Date/Time: Erickson/2:15:21 PM    Final (Updated)    MONITORS  LONG TERM MONITOR (3-14 DAYS)  06/21/2024  Narrative Patch Wear Time:  6 days and 14 hours  HR 42 - 179, average 70 bpm. Atrial fibrillation detected. Rare ventricular ectopy. 100% atrial fibrillation burden No symptom trigger episodes. 2 VT episodes noted, longest 7 beats  Cardiology referral placed based on protocol.   Will Gladis Norton Cardiac Electrophysiology   CT SCANS  CT CARDIAC SCORING (SELF PAY ONLY) 02/11/2023  Addendum 02/17/2023 11:19 AM ADDENDUM REPORT: 02/17/2023 11:17  EXAM: OVER-READ INTERPRETATION  CT CHEST  The following report is an over-read performed by radiologist Dr. Andrea Gasman of [facility] on 02/17/2023. This over-read does not include interpretation of cardiac or coronary anatomy or pathology. The coronary calcium  score interpretation by the cardiologist is attached.  COMPARISON:  None.  FINDINGS: Vascular: Mild aortic atherosclerosis. The included aorta is normal in caliber.  Mediastinum/nodes: No adenopathy or mass.  Small hiatal hernia.  Lungs: Chain sutures in the right lung. Occasional subsegmental bandlike atelectasis or scarring. Punctate granuloma in the left lower lobe, benign. No focal airspace disease. Occasional thin walled pulmonary cysts. No pleural fluid. The included airways are patent.  Upper abdomen: No acute or unexpected findings.  Musculoskeletal: There are no acute or suspicious osseous abnormalities.  IMPRESSION: 1. Small hiatal hernia. 2. Postsurgical change of the right lung.  Aortic Atherosclerosis (ICD10-I70.0).   Electronically Signed By: Andrea Gasman M.D. On: 02/17/2023 11:17  Narrative CLINICAL DATA:  Cardiovascular disease risk stratification  CAD screening, intermediate CAD risk, not treadmill candidate  EXAM: CT Coronary Calcium  Score  TECHNIQUE: A gated, non-contrast computed tomography scan of the heart was performed using 3mm slice thickness. Axial images were analyzed  on a dedicated  workstation. Calcium  scoring of the coronary arteries was performed using the Agatston method.  FINDINGS: Coronary Calcium  Score:  Left main: 6.1  Left anterior descending artery: 39.1  Left circumflex artery: 0  Right coronary artery: 1.22  Total: 46.4  Percentile: 54th  Mild mitral annular calcifications.  Pericardium: Normal.  Ascending Aorta: Normal caliber. Ascending aorta measures approximately 34mm at the mid ascending aorta measured in a non-contrast axial plane. Aortic root atherosclerosis.  Non-cardiac: See separate report from [facility].  IMPRESSION: Coronary calcium  score of 46.4. This was 54th percentile for age-, race-, and sex-matched controls.  RECOMMENDATIONS: Coronary artery calcium  (CAC) score is a strong predictor of incident coronary heart disease (CHD) and provides predictive information beyond traditional risk factors. CAC scoring is reasonable to use in the decision to withhold, postpone, or initiate statin therapy in intermediate-risk or selected borderline-risk asymptomatic adults (age 72-75 years and LDL-C >=70 to <190 mg/dL) who do not have diabetes or established atherosclerotic cardiovascular disease (ASCVD).* In intermediate-risk (10-year ASCVD risk >=7.5% to <20%) adults or selected borderline-risk (10-year ASCVD risk >=5% to <7.5%) adults in whom a CAC score is measured for the purpose of making a treatment decision the following recommendations have been made:  If CAC=0, it is reasonable to withhold statin therapy and reassess in 5 to 10 years, as long as higher risk conditions are absent (diabetes mellitus, family history of premature CHD in first degree relatives (males <55 years; females <65 years), cigarette smoking, or LDL >=190 mg/dL).  If CAC is 1 to 99, it is reasonable to initiate statin therapy for patients >=48 years of age.  If CAC is >=100 or >=75th percentile, it is reasonable to initiate statin therapy  at any age.  Cardiology referral should be considered for patients with CAC scores >=400 or >=75th percentile.  *2018 AHA/ACC/AACVPR/AAPA/ABC/ACPM/ADA/AGS/APhA/ASPC/NLA/PCNA Guideline on the Management of Blood Cholesterol: A Report of the American College of Cardiology/American Heart Association Task Force on Clinical Practice Guidelines. J Am Coll Cardiol. 2019;73(24):3168-3209.  Electronically Signed: By: Soyla Merck M.D. On: 02/11/2023 08:54     ______________________________________________________________________________________________     EKG Interpretation Date/Time:  Tuesday July 05 2024 13:34:53 EST Ventricular Rate:  57 PR Interval:  288 QRS Duration:  66 QT Interval:  418 QTC Calculation: 406 R Axis:   7  Text Interpretation: Sinus bradycardia with 1st degree A-V block When compared with ECG of 04-Jul-2024 07:45, Premature atrial complexes are no longer Present Confirmed by Shlomo Corning [phone]) on 07/05/2024 1:56:46 PM   Recent Labs: 06/07/2024: ALT 10; BUN 15; Creatinine, Ser 0.91; Hemoglobin 12.5; Platelets 278; Potassium 4.2; Sodium 139; TSH 0.483   Lipid Panel    Component Value Date/Time   CHOL 181 06/07/2024 0938   TRIG 96 06/07/2024 0938   HDL 53 06/07/2024 0938   CHOLHDL 3.4 06/07/2024 0938   CHOLHDL 4 02/06/2023 0951   VLDL 23.0 02/06/2023 0951   LDLCALC 111 (H) 06/07/2024 0938   LDLDIRECT 161.3 01/07/2010 0948     CHA2DS2-VASc Score = 3   This indicates a 3.2% annual risk of stroke. The patient's score is based upon: CHF History: 0 HTN History: 0 Diabetes History: 0 Stroke History: 0 Vascular Disease History: 1 Age Score: 1 Gender Score: 1          ASSESSMENT:    1. Persistent atrial fibrillation (HCC)   2. Dizziness   3. Agatston coronary artery calcium  score less than 100   4. Aortic atherosclerosis  5. Pure hypercholesterolemia      PLAN:  In order of problems listed above:  Persistent atrial fibrillation -  Initially diagnosed with A-fib on 06/20/2024 and her PCP office - Seen in A-fib clinic but has not been seen by an attending with cardiology - Status post direct-current cardioversion yesterday successfully to sinus rhythm with no complications - EKG today demonstrates - Continue apixaban  5 mg twice daily  Dizziness - sounded like a vasovagal episode - Complained of dizziness in the [facility] office today.  Heart rate was in the 60s heart rate was 116 mmHg systolic. Dizziness started while she was getting BP checked and cuff was very tight.  Complained of feeling hot and flushed and felt very dizzy.  She was given hydration in the office with improvement in symptoms - Maintaining sinus bradycardia on exam and EKG in the office  - Orthostatic blood pressures today in the office were normal with no symptoms - Will get a 2 week Zio patch to rule out bradycardia arrhythmias as a cause of dizziness  - Encouraged her to stay hydrated  Coronary artery calcifications Aortic atherosclerosis Hyperlipidemia - Coronary calcium  score 01/2023 showed a coronary calcium  score of 46.4 which was 54th percentile for age, race and sex matched controls and also aortic atherosclerosis -LDL goal less than 65 -I have personally reviewed and interpreted outside labs performed by patient's PCP which showed LDL 111 HDL 53 and triglycerides 96 -Start Crestor  5 mg daily -Repeat FLP and ALT in 6 weeks  Time Spent: 20 minutes total time of encounter, including 15 minutes spent in face-to-face patient care on the date of this encounter. This time includes coordination of care and counseling regarding above mentioned problem list. Remainder of non-face-to-face time involved reviewing chart documents/testing relevant to the patient encounter and documentation in the medical record. I have independently reviewed documentation from referring provider  Followup: 4 weeks with Dr. Francyne  Medication Adjustments/Labs and Tests  Ordered: Current medicines are reviewed at length with the patient today.  Concerns regarding medicines are outlined above.  Medication changes, Labs and Tests ordered today are listed in the Patient Instructions below.  There are no Patient Instructions on file for this visit.   Signed, Wilbert Bihari, MD  07/05/2024 1:59 PM    [facility] [address] [place], [place], [place]  [address] Phone: [phone]; Fax: [phone]      [1]  Current Meds  Medication Sig   apixaban  (ELIQUIS ) 5 MG TABS tablet Take 1 tablet (5 mg total) by mouth 2 (two) times daily.   Calcium -Phosphorus-Vitamin D  (CALCIUM  GUMMIES PO) Take 2-3 each by mouth daily.   Cholecalciferol (VITAMIN D3) 125 MCG (5000 UT) CAPS Take 5,000 Units by mouth daily.   cyanocobalamin  (VITAMIN B12) 1000 MCG tablet Take 1,000 mcg by mouth daily.   levothyroxine  (SYNTHROID ) 88 MCG tablet Take 88 mcg by mouth daily before breakfast.   liothyronine (CYTOMEL) 5 MCG tablet Take 15 mcg by mouth daily. Patient takes 5mg  (Patient taking differently: Take 15 mcg by mouth daily.)   omeprazole  (PRILOSEC) 40 MG capsule Take 1 capsule (40 mg total) by mouth in the morning and at bedtime. (Patient taking differently: Take 80 mg by mouth at bedtime.)   raloxifene (EVISTA) 60 MG tablet Take 60 mg by mouth daily.

## 2024-07-05 NOTE — Telephone Encounter (Signed)
 [facility] called in regards to pt having dizziness in office after Cardioversion yesterday. Matt, [place] requesting patient be seen by DOD and not have to go to the ED if possible. Pt has not been seen by Cards yet, so would be a new patient. Spoke with Geni, covering for DOD 1, and updated with information. Transferred with [facility] to plan the most appropriate solution for this patient.

## 2024-07-05 NOTE — Telephone Encounter (Signed)
 Received call from [facility] regarding patient with cardioversion yesterday and complaining of dizziness today. HRs are in the 60s and they are giving pt hydration in office. Requested pt be seen today - no available appts in afib clinic today - Daril Kicks PA recommended DOD with heartcare or ER if pt very symptomatic.  Phone number for heartcare given.

## 2024-07-05 NOTE — Patient Instructions (Signed)
 Medication Instructions:  Please START crestor  5 mg daily at bedtime.  *If you need a refill on your cardiac medications before your next appointment, please call your pharmacy*  Lab Work: Please complete a FASTING lipid panel and an ALT in 6 weeks (end of February 2026).  If you have labs (blood work) drawn today and your tests are completely normal, you will receive your results only by: MyChart Message (if you have MyChart) OR A paper copy in the mail If you have any lab test that is abnormal or we need to change your treatment, we will call you to review the results.  Testing/Procedures: Molly Erickson- Long Term Monitor Instructions  Your physician has requested you wear a ZIO patch monitor for 14 days.  This is a single patch monitor. [employer] supplies one patch monitor per enrollment. Additional stickers are not available. Please do not apply patch if you will be having a Nuclear Stress Test,  Echocardiogram, Cardiac CT, MRI, or Chest Xray during the period you would be wearing the  monitor. The patch cannot be worn during these tests. You cannot remove and re-apply the  ZIO XT patch monitor.  Your ZIO patch monitor will be mailed 3 day USPS to your address on file. It may take 3-5 days  to receive your monitor after you have been enrolled.  Once you have received your monitor, please review the enclosed instructions. Your monitor  has already been registered assigning a specific monitor serial # to you.  Billing and Patient Assistance Program Information  We have supplied [employer] with any of your insurance information on file for billing purposes. [employer] offers a sliding scale Patient Assistance Program for patients that do not have  insurance, or whose insurance does not completely cover the cost of the ZIO monitor.  You must apply for the Patient Assistance Program to qualify for this discounted rate.  To apply, please call [employer] at [phone], select option 4, select option 2,  ask to apply for  Patient Assistance Program. Meredeth will ask your household income, and how many people  are in your household. They will quote your out-of-pocket cost based on that information.  [employer] will also be able to set up a 36-month, interest-free payment plan if needed.  Applying the monitor   Shave hair from upper left chest.  Hold abrader disc by orange tab. Rub abrader in 40 strokes over the upper left chest as  indicated in your monitor instructions.  Clean area with 4 enclosed alcohol pads. Let dry.  Apply patch as indicated in monitor instructions. Patch will be placed under collarbone on left  side of chest with arrow pointing upward.  Rub patch adhesive wings for 2 minutes. Remove white label marked 1. Remove the white  label marked 2. Rub patch adhesive wings for 2 additional minutes.  While looking in a mirror, press and release button in center of patch. A small green light will  flash 3-4 times. This will be your only indicator that the monitor has been turned on.  Do not shower for the first 24 hours. You may shower after the first 24 hours.  Press the button if you feel a symptom. You will hear a small click. Record Date, Time and  Symptom in the Patient Logbook.  When you are ready to remove the patch, follow instructions on the last 2 pages of Patient  Logbook. Stick patch monitor onto the last page of Patient Logbook.  Place Patient Logbook in the  blue and white box. Use locking tab on box and tape box closed  securely. The blue and white box has prepaid postage on it. Please place it in the mailbox as  soon as possible. Your physician should have your test results approximately 7 days after the  monitor has been mailed back to [facility].  Call [facility] Customer Care at [phone] if you have questions regarding  your ZIO XT patch monitor. Call them immediately if you see an orange light blinking on your  monitor.  If your monitor falls off  in less than 4 days, contact our Monitor department at [phone].  If your monitor becomes loose or falls off after 4 days call [employer] at [phone] for  suggestions on securing your monitor   Follow-Up: At [facility], you and your health needs are our priority.  As part of our continuing mission to provide you with exceptional heart care, our providers are all part of one team.  This team includes your primary Cardiologist (physician) and Advanced Practice Providers or APPs (Physician Assistants and Nurse Practitioners) who all work together to provide you with the care you need, when you need it.  Your next appointment:   4 week(s)  Provider:   Dr. Francyne

## 2024-07-05 NOTE — Addendum Note (Signed)
 Addended by: JANIT GENI CROME on: 07/05/2024 02:13 PM   Modules accepted: Orders

## 2024-07-05 NOTE — Progress Notes (Unsigned)
 Enrolled patient for a 14 day Zio XT  monitor to be mailed to patients home

## 2024-07-08 ENCOUNTER — Telehealth: Payer: Self-pay | Admitting: Emergency Medicine

## 2024-07-08 NOTE — Telephone Encounter (Signed)
 Appt with me 01/29. Ok to cancel the AFib clinic appt.

## 2024-07-08 NOTE — Telephone Encounter (Signed)
 S/w the patient- was calling to schedule a follow up appointment. She recently had an appt with Dr Shlomo where a Zio monitor was ordered and she was started on Crestor .  The patient reports: Got a cardioversion on 07/04/24. She has been on Eliquis  5 mg bid since 06/06/24. She reports that she went back into Afib yesterday (07/07/24) morning and also reports that her husband had a stroke yesterday. He should be d/c'd tomorrow? She is pretty stressed out right now.  Her heart rate has been running 60's-80's (which is higher for her).  She has not checked her BP.  She reports no shortness of breath or dizziness.  She has an upcoming appointment with Afib clinic on 07/18/24.   Informed her that I would give this information to Dr Francyne and will be in touch with recommendations for- if she needs a sooner appointment or if the one on 07/18/24 is sufficient/when she needs f/u appt with Dr C.  Given ER precautions, she verbalized understanding.

## 2024-07-18 ENCOUNTER — Ambulatory Visit (HOSPITAL_COMMUNITY): Admitting: Physician Assistant

## 2024-07-20 NOTE — Progress Notes (Unsigned)
 " Cardiology Office Note   Date:  07/21/2024  ID:  Molly Erickson, DOB February 27, 1951, MRN [id number] PCP: Geofm Glade PARAS, MD  [facility] Providers Cardiologist:  None     History of Present Illness Molly Erickson is a 74 y.o. female with a recent diagnosis of atrial fibrillation, transitioning cardiology care from Dr. Shlomo.  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  Atrial fibrillation was diagnosed incidentally during a routine visit with PCP on 06/06/2024.  The ventricular rate was spontaneously well-controlled.  ECG shows somewhat delayed anterior R wave progression but was otherwise a normal tracing.  She was completely unaware of the arrhythmia and has not been troubled by palpitations.  Retrospectively, she has probably been in atrial fibrillation much longer.  I reviewed her echocardiogram from March 2026.  She was in atrial fibrillation with controlled ventricular rate at that time, even though it was not recognized.  Over the summer, during a trip to [place] she had difficulty keeping up with her husband when walking up steep slopes.  She subsequently wore a 7-day arrhythmia monitor in late December 2025, that showed that the atrial fibrillation was persistent and consistently well rate controlled.  Brief episodes of nonsustained ventricular tachycardia lasting a maximum of 11 beats were rarely recorded.  These were also asymptomatic.  Only 1 pause of 3.0 seconds in duration was recorded, possibly during sleep at approximately 10 PM, likewise asymptomatic.   She underwent an elective cardioversion on 07/04/2024 and was confirmed to be in sinus rhythm the following day on 07/05/2024.  It is not clear that the presence of sinus rhythm led to any improvement in her symptoms.  On that day, she was being  evaluated in the vascular clinic and had a near syncopal event, but her rhythm was normal sinus/mild sinus bradycardia.  Just 2 days later her husband had an ischemic stroke and she was back in atrial fibrillation.  She really cannot tell that she had any improvement in her overall stamina while in sinus rhythm compared to atrial fibrillation.   Following her cardioversion she is now wearing another monitor for 2 weeks.  An echocardiogram performed in March 2025 for cardiomegaly showed normal left ventricular size and function with EF 60 to 65%, mildly dilated left atrium (based on the recorded measurements it is actually moderately dilated), mild mitral insufficiency.  Diastolic parameters were described as indeterminate, with a reported E/A ratio of 2.76, but based on the tissue Doppler waveforms, the rhythm was atrial fibrillation at that time as well.  The patient specifically denies any chest pain at rest or with exertion, dyspnea at rest or with exertion, orthopnea, paroxysmal nocturnal dyspnea, syncope, palpitations, focal neurological deficits, intermittent claudication, lower extremity edema, unexplained weight gain, cough, hemoptysis or wheezing.  She has varicose veins of the lower extremity but without significant edema.  She underwent venous reflux study evaluation in October 2025 which showed no evidence of DVT and showed that there was venous reflux in the right common femoral vein, not only in the greater saphenous vein.  She had an average coronary calcium  score in 2024 at age 79 (48.4, 54th percentile, also evidence of aortic atherosclerosis and mitral annular calcification).  The thoracic aorta was normal in caliber.  She does not have diabetes mellitus.  Her most recent lipid profile was quite favorable with an HDL of 53 and normal triglycerides, albeit with borderline elevated  LDL cholesterol 211.  She had normal thyroid  parameters in December 2025.  She has normal renal  function.  Wegener's granulomatosis appears to be quiescent.  She is not taking any medications for this.  Recent ESR was normal.   Studies Reviewed EKG Interpretation Date/Time:  Thursday July 21 2024 09:38:31 EST Ventricular Rate:  71 PR Interval:    QRS Duration:  72 QT Interval:  386 QTC Calculation: 419 R Axis:   -26  Text Interpretation: Atrial fibrillation When compared with ECG of 05-Jul-2024 13:34, Atrial fibrillation has replaced Sinus rhythm QRS axis Shifted left Confirmed by Tylee Newby ([id number]) on 07/21/2024 5:23:20 PM     Arrhythmia monitor 06/21/2024. Echocardiogram 09/17/2023. Personally reviewed ECG from 06/06/2024 which shows atrial fibrillation with controlled ventricular rates and somewhat delayed R wave progression across the anterior precordium  Risk Assessment/Calculations  CHA2DS2-VASc Score = 3   This indicates a 3.2% annual risk of stroke. The patient's score is based upon: CHF History: 0 HTN History: 0 Diabetes History: 0 Stroke History: 0 Vascular Disease History: 1 Age Score: 1 Gender Score: 1            Physical Exam VS:  BP 98/63   Pulse 71   Ht 5' 6 (1.676 m)   Wt 169 lb 4.8 oz (76.8 kg)   SpO2 98%   BMI 27.33 kg/m        Wt Readings from Last 3 Encounters:  07/21/24 169 lb 4.8 oz (76.8 kg)  07/05/24 167 lb (75.8 kg)  07/05/24 167 lb 11.2 oz (76.1 kg)    GEN: Well nourished, well developed in no acute distress NECK: No JVD; No carotid bruits CARDIAC: irregular, no murmurs, rubs, gallops RESPIRATORY:  Clear to auscultation without rales, wheezing or rhonchi  ABDOMEN: Soft, non-tender, non-distended EXTREMITIES:  No edema; No deformity   ASSESSMENT AND PLAN Persistent atrial fibrillation: This has been present at least since March 2025, about a year.  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  May eventually require pacemaker.  On appropriate anticoagulation.  Discuss another attempt  at cardioversion following some type of antiarrhythmic intervention, either medication or ablation.  Because of the presence of conduction disease, many antiarrhythmic medications may not be well-tolerated (such as amiodarone, dronedarone, sotalol) but we could try flecainide or dofetilide.  If she has improved symptoms doing normal sinus rhythm, we could then consider ablation as a better long-term alternative.  We discussed these options, but will wait until we see the results of her current arrhythmia monitor.  If she has clear evidence of lengthy pauses (over 3 seconds), she will probably require pacemaker anyway. Anticoagulation: Tolerating apixaban  so far without any bleeding problems. Hypothyroidism: Euthyroid clinically and based on most recent lab tests. Peripheral venous insufficiency: Reflux involves deep femoral vein system as well.  Manage conservatively with the leg elevation and compression stockings.  She does not have a lot of edema and has not had ulcerations. 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  Not on active immune suppressive/anti-inflammatory therapy at this time. HLP: She has an average coronary calcium  score and does not have any clinical coronary or vascular disease.  LDL cholesterol is 111.  She has started treatment with a statin.  Repeat a lipid profile in 2-3 months.       Dispo: Will discuss whether or not we want to consider antiarrhythmic therapy after review her current monitor.  Repeat lipid panel  towards the end of February.  Signed, Jerel Balding, MD   "

## 2024-07-21 ENCOUNTER — Encounter: Payer: Self-pay | Admitting: Cardiovascular Disease

## 2024-07-21 ENCOUNTER — Ambulatory Visit: Admitting: Cardiovascular Disease

## 2024-07-21 VITALS — BP 98/63 | HR 71 | Ht 66.0 in | Wt 169.3 lb

## 2024-07-21 DIAGNOSIS — E7800 Pure hypercholesterolemia, unspecified: Secondary | ICD-10-CM

## 2024-07-21 DIAGNOSIS — I872 Venous insufficiency (chronic) (peripheral): Secondary | ICD-10-CM

## 2024-07-21 DIAGNOSIS — E039 Hypothyroidism, unspecified: Secondary | ICD-10-CM

## 2024-07-21 DIAGNOSIS — M3130 Wegener's granulomatosis without renal involvement: Secondary | ICD-10-CM

## 2024-07-21 DIAGNOSIS — I4819 Other persistent atrial fibrillation: Secondary | ICD-10-CM | POA: Diagnosis not present

## 2024-07-21 DIAGNOSIS — D6869 Other thrombophilia: Secondary | ICD-10-CM | POA: Diagnosis not present

## 2024-07-21 NOTE — Patient Instructions (Signed)
 Medication Instructions:  No changes *If you need a refill on your cardiac medications before your next appointment, please call your pharmacy*  Lab Work: None ordered If you have labs (blood work) drawn today and your tests are completely normal, you will receive your results only by: MyChart Message (if you have MyChart) OR A paper copy in the mail If you have any lab test that is abnormal or we need to change your treatment, we will call you to review the results.  Testing/Procedures: None ordered  Follow-Up: At [facility], you and your health needs are our priority.  As part of our continuing mission to provide you with exceptional heart care, our providers are all part of one team.  This team includes your primary Cardiologist (physician) and Advanced Practice Providers or APPs (Physician Assistants and Nurse Practitioners) who all work together to provide you with the care you need, when you need it.  Your next appointment:    3-4 months  Provider:   Dr Francyne  We recommend signing up for the patient portal called MyChart.  Sign up information is provided on this After Visit Summary.  MyChart is used to connect with patients for Virtual Visits (Telemedicine).  Patients are able to view lab/test results, encounter notes, upcoming appointments, etc.  Non-urgent messages can be sent to your provider as well.   To learn more about what you can do with MyChart, go to [email or web address].

## 2024-07-28 ENCOUNTER — Other Ambulatory Visit: Payer: Self-pay | Admitting: Internal Medicine

## 2024-07-28 DIAGNOSIS — K222 Esophageal obstruction: Secondary | ICD-10-CM

## 2024-07-28 DIAGNOSIS — R1310 Dysphagia, unspecified: Secondary | ICD-10-CM

## 2024-11-18 ENCOUNTER — Ambulatory Visit: Admitting: Cardiovascular Disease
